# Patient Record
Sex: Female | Born: 1980 | Race: Black or African American | Hispanic: No | Marital: Single | Smoking: Never smoker
Health system: Southern US, Community
[De-identification: ages and names within clinical notes are randomized; demographics above are authoritative.]

## PROBLEM LIST (undated history)

## (undated) ENCOUNTER — Emergency Department (HOSPITAL_COMMUNITY): Admission: EM | Payer: BC Managed Care – PPO | Source: Home / Self Care

## (undated) DIAGNOSIS — Z789 Other specified health status: Secondary | ICD-10-CM

## (undated) HISTORY — DX: Other specified health status: Z78.9

## (undated) HISTORY — PX: BUNIONECTOMY: SHX129

---
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
## 2000-06-07 ENCOUNTER — Inpatient Hospital Stay (HOSPITAL_COMMUNITY): Admission: AD | Admit: 2000-06-07 | Discharge: 2000-06-07 | Payer: Self-pay | Admitting: Obstetrics & Gynecology

## 2000-07-06 ENCOUNTER — Emergency Department (HOSPITAL_COMMUNITY): Admission: EM | Admit: 2000-07-06 | Discharge: 2000-07-06 | Payer: Self-pay | Admitting: Emergency Medicine

## 2001-06-07 ENCOUNTER — Inpatient Hospital Stay (HOSPITAL_COMMUNITY): Admission: AD | Admit: 2001-06-07 | Discharge: 2001-06-07 | Payer: Self-pay | Admitting: *Deleted

## 2001-06-14 ENCOUNTER — Emergency Department (HOSPITAL_COMMUNITY): Admission: EM | Admit: 2001-06-14 | Discharge: 2001-06-14 | Payer: Self-pay | Admitting: Emergency Medicine

## 2003-10-19 ENCOUNTER — Inpatient Hospital Stay (HOSPITAL_COMMUNITY): Admission: AD | Admit: 2003-10-19 | Discharge: 2003-10-19 | Payer: Self-pay | Admitting: *Deleted

## 2003-10-22 ENCOUNTER — Encounter: Admission: RE | Admit: 2003-10-22 | Discharge: 2003-10-22 | Payer: Self-pay | Admitting: *Deleted

## 2010-01-26 ENCOUNTER — Inpatient Hospital Stay (HOSPITAL_COMMUNITY)
Admission: AD | Admit: 2010-01-26 | Discharge: 2010-01-26 | Payer: Self-pay | Source: Home / Self Care | Admitting: Obstetrics & Gynecology

## 2010-03-22 ENCOUNTER — Encounter: Payer: Self-pay | Admitting: Obstetrics and Gynecology

## 2010-05-13 LAB — URINALYSIS, ROUTINE W REFLEX MICROSCOPIC
Bilirubin Urine: NEGATIVE
Nitrite: NEGATIVE
Specific Gravity, Urine: 1.02 (ref 1.005–1.030)
Urobilinogen, UA: 0.2 mg/dL (ref 0.0–1.0)
pH: 5.5 (ref 5.0–8.0)

## 2010-07-08 ENCOUNTER — Inpatient Hospital Stay (HOSPITAL_COMMUNITY)
Admission: AD | Admit: 2010-07-08 | Payer: Medicaid Other | Source: Ambulatory Visit | Admitting: Obstetrics and Gynecology

## 2010-07-10 ENCOUNTER — Inpatient Hospital Stay (HOSPITAL_COMMUNITY)
Admission: AD | Admit: 2010-07-10 | Discharge: 2010-07-14 | DRG: 766 | Disposition: A | Discharge status: Home / Self Care | Payer: 59 | Source: Ambulatory Visit | Attending: Obstetrics and Gynecology | Admitting: Obstetrics and Gynecology

## 2010-07-10 ENCOUNTER — Inpatient Hospital Stay (HOSPITAL_COMMUNITY): Payer: 59

## 2010-07-10 ENCOUNTER — Encounter (HOSPITAL_COMMUNITY): Payer: Self-pay

## 2010-07-10 LAB — CBC
Hemoglobin: 12.4 g/dL (ref 12.0–15.0)
MCH: 27.8 pg (ref 26.0–34.0)
Platelets: 194 10*3/uL (ref 150–400)
RBC: 4.46 MIL/uL (ref 3.87–5.11)

## 2010-07-11 ENCOUNTER — Other Ambulatory Visit: Payer: Self-pay | Admitting: Obstetrics and Gynecology

## 2010-07-12 LAB — CBC
MCH: 27.2 pg (ref 26.0–34.0)
MCHC: 33.2 g/dL (ref 30.0–36.0)
MCV: 81.8 fL (ref 78.0–100.0)
Platelets: 187 10*3/uL (ref 150–400)
RBC: 3.35 MIL/uL — ABNORMAL LOW (ref 3.87–5.11)

## 2010-07-18 ENCOUNTER — Encounter (HOSPITAL_COMMUNITY): Payer: Self-pay | Admitting: Radiology

## 2010-07-18 ENCOUNTER — Inpatient Hospital Stay (HOSPITAL_COMMUNITY): Payer: 59

## 2010-07-18 ENCOUNTER — Inpatient Hospital Stay (HOSPITAL_COMMUNITY)
Admission: AD | Admit: 2010-07-18 | Discharge: 2010-07-21 | DRG: 776 | Disposition: A | Discharge status: Home / Self Care | Payer: 59 | Source: Ambulatory Visit | Attending: Obstetrics and Gynecology | Admitting: Obstetrics and Gynecology

## 2010-07-18 DIAGNOSIS — J811 Chronic pulmonary edema: Secondary | ICD-10-CM | POA: Diagnosis present

## 2010-07-18 DIAGNOSIS — D649 Anemia, unspecified: Secondary | ICD-10-CM | POA: Diagnosis present

## 2010-07-18 DIAGNOSIS — J90 Pleural effusion, not elsewhere classified: Secondary | ICD-10-CM | POA: Diagnosis present

## 2010-07-18 DIAGNOSIS — O99893 Other specified diseases and conditions complicating puerperium: Principal | ICD-10-CM | POA: Diagnosis present

## 2010-07-18 DIAGNOSIS — O9081 Anemia of the puerperium: Secondary | ICD-10-CM | POA: Diagnosis present

## 2010-07-18 LAB — URINALYSIS, ROUTINE W REFLEX MICROSCOPIC
Glucose, UA: NEGATIVE mg/dL
Specific Gravity, Urine: 1.015 (ref 1.005–1.030)
pH: 8.5 — ABNORMAL HIGH (ref 5.0–8.0)

## 2010-07-18 LAB — CBC
MCH: 26.5 pg (ref 26.0–34.0)
MCV: 80.1 fL (ref 78.0–100.0)
Platelets: 307 10*3/uL (ref 150–400)
RDW: 15.7 % — ABNORMAL HIGH (ref 11.5–15.5)
WBC: 10.2 10*3/uL (ref 4.0–10.5)

## 2010-07-18 LAB — DIFFERENTIAL
Basophils Relative: 0 % (ref 0–1)
Eosinophils Absolute: 0.1 10*3/uL (ref 0.0–0.7)
Eosinophils Relative: 1 % (ref 0–5)
Lymphs Abs: 1.7 10*3/uL (ref 0.7–4.0)
Monocytes Relative: 7 % (ref 3–12)

## 2010-07-18 LAB — URINE MICROSCOPIC-ADD ON

## 2010-07-18 LAB — COMPREHENSIVE METABOLIC PANEL
Albumin: 1.8 g/dL — ABNORMAL LOW (ref 3.5–5.2)
BUN: 6 mg/dL (ref 6–23)
Creatinine, Ser: 0.58 mg/dL (ref 0.4–1.2)
GFR calc Af Amer: >60 mL/min (ref >60)
Total Protein: 5.6 g/dL — ABNORMAL LOW (ref 6.0–8.3)

## 2010-07-18 LAB — D-DIMER, QUANTITATIVE: D-Dimer, Quant: 12.41 ug/mL-FEU — ABNORMAL HIGH (ref 0.00–0.48)

## 2010-07-19 ENCOUNTER — Inpatient Hospital Stay (HOSPITAL_COMMUNITY): Payer: 59

## 2010-07-19 LAB — PRO B NATRIURETIC PEPTIDE
Pro B Natriuretic peptide (BNP): 892.3 pg/mL — ABNORMAL HIGH (ref 0–125)
Pro B Natriuretic peptide (BNP): 638.1 pg/mL — ABNORMAL HIGH (ref 0–125)

## 2010-07-19 LAB — CBC
Hemoglobin: 8.4 g/dL — ABNORMAL LOW (ref 12.0–15.0)
MCH: 26.8 pg (ref 26.0–34.0)
MCHC: 33.3 g/dL (ref 30.0–36.0)

## 2010-07-19 LAB — BASIC METABOLIC PANEL
CO2: 25 mEq/L (ref 19–32)
Calcium: 8.7 mg/dL (ref 8.4–10.5)
Creatinine, Ser: 0.54 mg/dL (ref 0.4–1.2)
GFR calc Af Amer: >60 mL/min (ref >60)

## 2010-07-19 MED ORDER — IOHEXOL 300 MG/ML  SOLN
100.0000 mL | Freq: Once | INTRAMUSCULAR | Status: AC | PRN
  Administered 2010-07-19: 100 mL via INTRAVENOUS

## 2010-07-20 LAB — BASIC METABOLIC PANEL
BUN: 8 mg/dL (ref 6–23)
Calcium: 8.8 mg/dL (ref 8.4–10.5)
Creatinine, Ser: 0.63 mg/dL (ref 0.4–1.2)
GFR calc non Af Amer: >60 mL/min (ref >60)
Potassium: 3.6 mEq/L (ref 3.5–5.1)

## 2010-07-20 LAB — CBC
MCH: 26.8 pg (ref 26.0–34.0)
MCV: 80.4 fL (ref 78.0–100.0)
Platelets: 355 10*3/uL (ref 150–400)
RDW: 15.8 % — ABNORMAL HIGH (ref 11.5–15.5)
WBC: 11.5 10*3/uL — ABNORMAL HIGH (ref 4.0–10.5)

## 2010-07-20 LAB — PRO B NATRIURETIC PEPTIDE: Pro B Natriuretic peptide (BNP): 167.2 pg/mL — ABNORMAL HIGH (ref 0–125)

## 2010-07-21 ENCOUNTER — Inpatient Hospital Stay (HOSPITAL_COMMUNITY): Payer: 59

## 2010-07-21 LAB — BASIC METABOLIC PANEL
Calcium: 8.8 mg/dL (ref 8.4–10.5)
GFR calc Af Amer: >60 mL/min (ref >60)
GFR calc non Af Amer: >60 mL/min (ref >60)
Sodium: 139 mEq/L (ref 135–145)

## 2010-07-21 LAB — CBC
MCHC: 33.2 g/dL (ref 30.0–36.0)
Platelets: 394 10*3/uL (ref 150–400)
RDW: 16.1 % — ABNORMAL HIGH (ref 11.5–15.5)

## 2010-07-21 NOTE — H&P (Signed)
[name], [name]             ACCOUNT NO.:  [id number]  MEDICAL RECORD NO.:  [id number]         PATIENT TYPE:  WINP  LOCATION:  315                           FACILITY:  [facility]  PHYSICIAN:  Pieter Partridge, MD   DATE OF BIRTH:  Jan 13, 1981  DATE OF ADMISSION: DATE OF DISCHARGE:                             HISTORY & PHYSICAL   ADMISSION DIAGNOSIS:  Status post cesarean section 1 week ago with pulmonary edema.  ANTICIPATED PROCEDURES:  IV Lasix and a cardiac workup.  HISTORY OF PRESENT ILLNESS:  Abigail Stuart is a 30 year old gravida 2, para 0-0-1-0 who delivered a viable female infant via cesarean section on Jul 11, 2010.  Indication for the cesarean section was failure to progress, light meconium, estimated blood loss at that time was 1000 mL.  Her postoperative course was uncomplicated.  She did have hematocrit of 27.4 at the time of discharge but lungs were clear.  Abigail Stuart presents today with a 2-day history of chest pain and shortness of breath, shortness of breath is worse when she lies flat, and she feels like her "chest is going to cave in."  In the past 2 days, she has had chills but no fever.  She has had a headache for the past 2 days but denies any visual changes.  It appears that this was a gradual onset and has progressively worsened.  She denies any sick contacts. She has had a minimal cough.  She is exclusively breast feeding which has been uncomplicated to date.  REVIEW OF SYSTEMS:  She denies any nausea, vomiting again she has night sweats and headache.  Lower extremities have been swollen and appear approximately 3+ pitting edema, but the patient says that this has improved from previous.  GENITOURINARY:  She does complain of burning after she finishes urinating and suspects she has urinary tract infection.  Her lochia is minimal and no issues.  She has no breast tenderness.  No breast engorgement.  The patient denies any leg pain or discoloration of her lower  extremities.  ALLERGIES:  No known drug allergies.  PAST MEDICAL HISTORY:  Remarkable for history of UTI, sickle cell trait, genital herpes, and depression.  PAST SURGICAL HISTORY:  She had a bunionectomy in May 2011.  PAST SURGICAL HISTORY:  Bunionectomy in May 2011.  OB HISTORY:  In 2003, she had an elective abortion at 10 weeks and she had this pregnancy, delivering and May of this year 2012.  Pregnancy was complicated by unwanted pregnancy, potential adoption, and the patient opted to keep the baby.  She also has possibly Bartholin gland cyst, history of depression, and anemia during the pregnancy.  FAMILY HISTORY:  Her brother has asthma.  Her maternal grandmother has congestive heart failure, breast cancer, and diabetes.  The patient has sickle cell.  SOCIAL HISTORY:  She is single, unemployed.  Lives alone, has support from family.  No tobacco, alcohol, or drug use reported.  PHYSICAL EXAMINATION:  VITAL SIGNS:  Temperature is 97.7 with 98.9 upon arrival, blood pressure is 123/77, heart rate is 52, and respirations 20.  O2 sats of 97% on room air. GENERAL:  The  patient is in no acute distress, however, when speaking for period of time she seems to take needed breath, reports some discomfort lying in the supine position at 45 degrees angle. HEENT:  No neck masses. CARDIOVASCULAR:  Regular rate and rhythm. BREASTS:  Breast are soft, not engorged, no erythema.  Nipples appear normal and without cracks. LUNGS:  Clear to auscultation.  Decreased breath sounds at the right base.  No crackles appreciated. ABDOMEN:  Soft, nontender, and nondistended.  Uterine fundus is not palpable.  Her incision still has Steri-Strips and appears clean and nontender.  No foul older noted. PELVIC:  Deferred. EXTREMITIES:  She has 3+ pitting edema up to the knee.  Ankles are impressive.  Again the patient says this is an improvement from previous, she has a negative Homans sign.  She does not  have any calf tenderness. NEURO:  2 to 3+ deep tendon reflexes.  LABORATORY DATA:  Her hemoglobin is 8.1.  White count is 10.2, platelets are 307.  Her differential is she does not have a left shift.  CMP is within normal limits.  Her creatinine is 0.58, glucose is 86.  LFTs are 17 at 19.  Urine she has large blood which is likely due to lochia, no ketones, small leukocyte esterase, and no nitrite.  She has minimal white blood cells on her urine and few epithelial.  Two-view chest x-ray showed mild interstitial prominence is felt to relate to relative fluid overload from recent pregnancy.  There is also a tiny bilateral pleural effusion effusions.  Heart is mildly enlarged.  There are minimal interstitial prominences in lungs also maybe a component of bronchitis or early pneumonia.  ASSESSMENT:  This is a 30 year old gravida 2, para 0-0-1-0 status post primary cesarean section 1 week with acute shortness of breath and chest pain and chest x-ray reveals small amount of bilateral pleural effusions indicative of pulmonary edema.  Heart is slightly enlarged.  The patient does report some of the findings on x-ray are suspicious for pneumonia. However, the white count is negative but the patient does report intermittent fevers and chills.  The patient does have some findings of pneumonia on the x-ray with normal white count, but she does report some fevers and chills. Concerned about the bilateral pleural effusions and orthopnea.  The patient will be treated for pulmonary edema, etiology of pulmonary edema needs to be investigated.  I do not think this is severe preeclampsia. Her blood pressure is normal, platelets are normal, and liver enzymes are also normal.  Pulmonary edema could be due to potentially some difficulty with cardiac in nature.  I will have an Internal Medicine consult to rule out postpartum cardiomyopathy, I suspect that we potentially may do a 2-D echo and if warranted.   EKG has also been ordered but has not then but has not been done.  Also for DVT with pulmonary embolus was suspected due to shortness of breath.  Her extremities are significantly swollen; and by the patient's history, it is an improvement.  There is no clinical evidence of a deep DVT.  If a spiral CT need to be ordered that is not by [place], if it is warranted, the hospitalist can order spiral CT.  The patient has complaints of burning when she urinates or pain at the end of her of urination, however, her urinalysis appears normal and a good clean catch, so I will withhold any treatment at this time.  I have reviewed all of the information with the  patient and discussed with her that she will be observed overnight and a hospitalist will be consulted.  She will get Lasix either 20 or 40 mg IV for some of the fluid and then we will potentially do a cardiac echo if necessary.     Pieter Partridge, MD     EBV/MEDQ  D:  07/18/2010  T:  07/19/2010  Job:  [id number]  Electronically Signed by Geryl Rankins MD on 07/21/2010 05:08:34 PM

## 2010-08-25 NOTE — Op Note (Signed)
  [name], Abigail Stuart             ACCOUNT NO.:  [id number]  MEDICAL RECORD NO.:  [id number]          PATIENT TYPE:  LOCATION:                                 FACILITY:  PHYSICIAN:  Arlyce Harman, MD          DATE OF BIRTH:  DATE OF PROCEDURE:  07/11/2010 DATE OF DISCHARGE:                              OPERATIVE REPORT   PROCEDURE:  Primary low-transverse cervical cesarean section.  PREOPERATIVE DIAGNOSES:  Elective sterilization, failed Essure, and menorrhagia.  POSTOPERATIVE DIAGNOSES:  Elective sterilization, failed Essure, and menorrhagia.  SURGEON:  Arlyce Harman, MD  No assistant.  The incision was made into the skin in a low-transverse cervical fashion and extended through the abdominal layers.  The uterus delivered.  The uterus was entered in a low-transverse cervical fashion and the infant was delivered.  The fluid was minimally meconium.  The head was delivered.  It was in occiput posterior position and no other mouth was suctioned out.  The infant was handed off to the nurse.  Uterus was sponged out and then closed with #1 Vicryl in a running locked fashion. The second layer being that of a modified Lambert stitch.  After hemostasis was obtained, the abdomen was closed.  The abdomen was irrigated and suctioned out and then it was examined and found to be normal without excess bleeding.  The peritoneum was closed.  The fascia was closed with 0-Vicryl in a running locked fashion.  The subcu was closed with 2-0 plain in a running fashion and the skin was approximated with Mellody Dance needle with 4-0 Vicryl suture and this was closed in a subcuticular fashion.  At the end of the procedure, sponge and instrument counts were correct.     Arlyce Harman, MD     EG/MEDQ  D:  07/11/2010  T:  07/12/2010  Job:  [id number]  Electronically Signed by Arlyce Harman MD on 08/25/2010 10:09:52 AM

## 2010-08-25 NOTE — Discharge Summary (Signed)
  Abigail Stuart, Abigail Stuart             ACCOUNT NO.:  [id number]  MEDICAL RECORD NO.:  [id number]           PATIENT TYPE:  I  LOCATION:  9146                          [facility]ILITY:  [facility]  PHYSICIAN:  Arlyce Harman, MD     DATE OF BIRTH:  [birth year]  DATE OF ADMISSION:  07/11/2010 DATE OF DISCHARGE:  07/14/2010                              DISCHARGE SUMMARY   The patient was admitted on Jul 11, 2010, and was in labor.  Labor progressed slowly and failed to progress for over 3-4 hours in spite of adequate labor and Pitocin augmentation; therefore, decision was made for  cesarean section.    The patient underwent primary low-transverse cervical cesarean section  and her postoperative course was uncomplicated.  She was then discharged  on Jul 14, 2010, in excellent condition and will follow up in my office  in 2 weeks for an incision check.  DISCHARGE MEDICATIONS: 1. Percocet 5 mg, she has 30 tablets, she is to take 1-2 every 4-6     hours as needed for pain. 2. Prenatal vitamins daily. 3. Iron tablets daily. 4. Stool Softeners as needed daily.  LABORATORY DATA:  Hemoglobin was 9.1 and hematocrit 27.4 on Jul 12, 2010.  Condition:  excellent  Instructions:  Call office for temperature of 101 or greater, heaving bleeding or severe pain. No sex for 6 weeks.Regualr diet.    Arlyce Harman, MD     EG/MEDQ  D:  07/14/2010  T:  07/14/2010  Job:  [id number]  Electronically Signed by Arlyce Harman MD on 08/25/2010 10:09:49 AM

## 2010-09-11 ENCOUNTER — Other Ambulatory Visit: Payer: Self-pay | Admitting: Internal Medicine

## 2010-09-11 ENCOUNTER — Ambulatory Visit
Admission: RE | Admit: 2010-09-11 | Discharge: 2010-09-11 | Disposition: A | Discharge status: Home / Self Care | Payer: 59 | Source: Ambulatory Visit | Attending: Internal Medicine | Admitting: Internal Medicine

## 2010-09-11 DIAGNOSIS — R200 Anesthesia of skin: Secondary | ICD-10-CM

## 2013-01-07 ENCOUNTER — Emergency Department (HOSPITAL_BASED_OUTPATIENT_CLINIC_OR_DEPARTMENT_OTHER)
Admission: EM | Admit: 2013-01-07 | Discharge: 2013-01-08 | Disposition: A | Discharge status: Home / Self Care | Payer: Medicaid Other | Attending: Emergency Medicine | Admitting: Emergency Medicine

## 2013-01-07 ENCOUNTER — Encounter (HOSPITAL_BASED_OUTPATIENT_CLINIC_OR_DEPARTMENT_OTHER): Payer: Self-pay | Admitting: Emergency Medicine

## 2013-01-07 DIAGNOSIS — G43809 Other migraine, not intractable, without status migrainosus: Secondary | ICD-10-CM | POA: Insufficient documentation

## 2013-01-07 DIAGNOSIS — G43109 Migraine with aura, not intractable, without status migrainosus: Secondary | ICD-10-CM

## 2013-01-07 MED ORDER — SODIUM CHLORIDE 0.9 % IV SOLN
1000.0000 mL | Freq: Once | INTRAVENOUS | Status: AC
  Administered 2013-01-08: 1000 mL via INTRAVENOUS

## 2013-01-07 MED ORDER — METOCLOPRAMIDE HCL 5 MG/ML IJ SOLN
10.0000 mg | Freq: Once | INTRAMUSCULAR | Status: AC
  Administered 2013-01-08: 10 mg via INTRAVENOUS
  Filled 2013-01-07: qty 2

## 2013-01-07 MED ORDER — SODIUM CHLORIDE 0.9 % IV SOLN
1000.0000 mL | INTRAVENOUS | Status: DC
  Administered 2013-01-08: 1000 mL via INTRAVENOUS

## 2013-01-07 MED ORDER — DIPHENHYDRAMINE HCL 50 MG/ML IJ SOLN
25.0000 mg | Freq: Once | INTRAMUSCULAR | Status: AC
  Administered 2013-01-08: 25 mg via INTRAVENOUS
  Filled 2013-01-07: qty 1

## 2013-01-07 NOTE — ED Notes (Signed)
C/o problems with right peripheral vision yesterday-states field of vision was decreased.  Now noting the same to her left eye today.  C/o soreness in her eye muscles.  Denies similar problem before.  History of scratched cornea.  Has not been wearing her contacts.  States her vision is currently blurry in her right eye.

## 2013-01-07 NOTE — ED Provider Notes (Signed)
CSN: [id number]     Arrival date & time 01/07/13  2128 History  This chart was scribed for Dione Booze, MD by Dorothey Baseman, ED Scribe. This patient was seen in room MH07/MH07 and the patient's care was started at 11:37 PM.    Chief Complaint  Patient presents with  . Eye Problem   The history is provided by the patient. No language interpreter was used.   HPI Comments: Abigail Stuart is a 32 y.o. female who presents to the Emergency Department complaining of  blurred vision that she states "feels like a film over my eye that won't go away," especially peripherally, onset yesterday that began in the left eye and she states that similar problems in the right eye presented today, right worse than left currently. She denies headache, but reports an associated pressure-like sensation to the right temple. She denies any potential injury or trauma to the area. Patient reports a history of corneal abrasion several months ago that was treated with eye drops without complication. Patient reports that her current symptoms do not feel similar to her prior corneal abrasion. She denies photophobia, nausea, emesis, dizziness, fever,or chills. She denies any other pertinent medical history. Patient is a non-smoker and an occasional, social drinker.   History reviewed. No pertinent past medical history. Past Surgical History  Procedure Laterality Date  . Bunionectomy    . Cesarean section     History reviewed. No pertinent family history. History  Substance Use Topics  . Smoking status: Never Smoker   . Smokeless tobacco: Not on file  . Alcohol Use: Yes     Comment: socially   OB History   Grav Para Term Preterm Abortions TAB SAB Ect Mult Living   1              Review of Systems  A complete 10 system review of systems was obtained and all systems are negative except as noted in the HPI and PMH.   Allergies  Review of patient's allergies indicates no known allergies.  Home Medications  No  current outpatient prescriptions on file.  Triage Vitals: BP 117/81  Temp(Src) 99 F (37.2 C) (Oral)  Resp 20  Ht 5\' 4"  (1.626 m)  Wt 193 lb (87.544 kg)  BMI 33.11 kg/m2  SpO2 100%  LMP 12/05/2012  Physical Exam  Nursing note and vitals reviewed. Constitutional: She is oriented to person, place, and time. She appears well-developed and well-nourished. No distress.  HENT:  Head: Normocephalic and atraumatic.  Mild photophobia present. Mild tenderness to palpation to the right temporalis muscle. Fundi poorly visualized. Anterior chambers clear.  Eyes: Conjunctivae and EOM are normal. Pupils are equal, round, and reactive to light.  Neck: Normal range of motion. Neck supple.  Cardiovascular: Normal rate, regular rhythm and normal heart sounds.  Exam reveals no gallop.   No murmur heard. Pulmonary/Chest: Effort normal and breath sounds normal. No respiratory distress. She has no wheezes. She has no rales.  Abdominal: She exhibits no distension.  Musculoskeletal: Normal range of motion.  Neurological: She is alert and oriented to person, place, and time.  Skin: Skin is warm and dry.  Psychiatric: She has a normal mood and affect. Her behavior is normal.    ED Course  Procedures (including critical care time)  DIAGNOSTIC STUDIES: Oxygen Saturation is 100% on room air, normal by my interpretation.    COORDINATION OF CARE: 11:41 PM- Ordered IV fluids, Reglan, and Benadryl to manage symptoms. Advised patient to follow  up with the referred ophthalmologist, especially if there are any new or worsening symptoms or if symptoms do not improve. Discussed treatment plan with patient at bedside and patient verbalized agreement.    MDM   1. Ocular migraine    Headache and blurred vision no with essentially normal eye exam. I suspect that she actually has a migraine headache with vision changes secondary to that. She will be given IV fluids, IV metoclopramide, IV diphenhydramine and  reassessed.  Headache has resolved with above noted treatment and vision has improved substantially. She will be referred to on-call of the myalgias to make sure there is no underlying eye problem, and will also be given a prescription for metoclopramide.  I personally performed the services described in this documentation, which was scribed in my presence. The recorded information has been reviewed and is accurate.      Dione Booze, MD 01/08/13 0120

## 2013-01-08 MED ORDER — METOCLOPRAMIDE HCL 10 MG PO TABS: 10.0000 mg | ORAL_TABLET | Freq: Four times a day (QID) | ORAL | Status: DC | PRN

## 2013-01-08 MED ORDER — METOCLOPRAMIDE HCL 5 MG/ML IJ SOLN
INTRAMUSCULAR | Status: AC
  Filled 2013-01-08: qty 2

## 2013-01-08 NOTE — Discharge Instructions (Signed)
Migraine Headache A migraine headache is an intense, throbbing pain on one or both sides of your head. A migraine can last for 30 minutes to several hours. CAUSES  The exact cause of a migraine headache is not always known. However, a migraine may be caused when nerves in the brain become irritated and release chemicals that cause inflammation. This causes pain. SYMPTOMS  Pain on one or both sides of your head.  Pulsating or throbbing pain.  Severe pain that prevents daily activities.  Pain that is aggravated by any physical activity.  Nausea, vomiting, or both.  Dizziness.  Pain with exposure to bright lights, loud noises, or activity.  General sensitivity to bright lights, loud noises, or smells. Before you get a migraine, you may get warning signs that a migraine is coming (aura). An aura may include:  Seeing flashing lights.  Seeing bright spots, halos, or zig-zag lines.  Having tunnel vision or blurred vision.  Having feelings of numbness or tingling.  Having trouble talking.  Having muscle weakness. MIGRAINE TRIGGERS  Alcohol.  Smoking.  Stress.  Menstruation.  Aged cheeses.  Foods or drinks that contain nitrates, glutamate, aspartame, or tyramine.  Lack of sleep.  Chocolate.  Caffeine.  Hunger.  Physical exertion.  Fatigue.  Medicines used to treat chest pain (nitroglycerine), birth control pills, estrogen, and some blood pressure medicines. DIAGNOSIS  A migraine headache is often diagnosed based on:  Symptoms.  Physical examination.  A CT scan or MRI of your head. TREATMENT Medicines may be given for pain and nausea. Medicines can also be given to help prevent recurrent migraines.  HOME CARE INSTRUCTIONS  Only take over-the-counter or prescription medicines for pain or discomfort as directed by your caregiver. The use of long-term narcotics is not recommended.  Lie down in a dark, quiet room when you have a migraine.  Keep a journal  to find out what may trigger your migraine headaches. For example, write down:  What you eat and drink.  How much sleep you get.  Any change to your diet or medicines.  Limit alcohol consumption.  Quit smoking if you smoke.  Get 7 to 9 hours of sleep, or as recommended by your caregiver.  Limit stress.  Keep lights dim if bright lights bother you and make your migraines worse. SEEK IMMEDIATE MEDICAL CARE IF:   Your migraine becomes severe.  You have a fever.  You have a stiff neck.  You have vision loss.  You have muscular weakness or loss of muscle control.  You start losing your balance or have trouble walking.  You feel faint or pass out.  You have severe symptoms that are different from your first symptoms. MAKE SURE YOU:   Understand these instructions.  Will watch your condition.  Will get help right away if you are not doing well or get worse. Document Released: 02/16/2005 Document Revised: 05/11/2011 Document Reviewed: 02/06/2011 [facility] Patient Information 2014 [place], [place].  Metoclopramide tablets What is this medicine? METOCLOPRAMIDE (met oh kloe PRA mide) is used to treat the symptoms of gastroesophageal reflux disease (GERD) like heartburn. It is also used to treat people with slow emptying of the stomach and intestinal tract. This medicine may be used for other purposes; ask your health care provider or pharmacist if you have questions. COMMON BRAND NAME(S): Reglan What should I tell my health care provider before I take this medicine? They need to know if you have any of these conditions: -breast cancer -depression -diabetes -heart failure -  high blood pressure -kidney disease -liver disease -Parkinson's disease or a movement disorder -pheochromocytoma -seizures -stomach obstruction, bleeding, or perforation -an unusual or allergic reaction to metoclopramide, procainamide, sulfites, other medicines, foods, dyes, or  preservatives -pregnant or trying to get pregnant -breast-feeding How should I use this medicine? Take this medicine by mouth with a glass of water. Follow the directions on the prescription label. Take this medicine on an empty stomach, about 30 minutes before eating. Take your doses at regular intervals. Do not take your medicine more often than directed. Do not stop taking except on the advice of your doctor or health care professional. A special MedGuide will be given to you by the pharmacist with each prescription and refill. Be sure to read this information carefully each time. Talk to your pediatrician regarding the use of this medicine in children. Special care may be needed. Overdosage: If you think you have taken too much of this medicine contact a poison control center or emergency room at once. NOTE: This medicine is only for you. Do not share this medicine with others. What if I miss a dose? If you miss a dose, take it as soon as you can. If it is almost time for your next dose, take only that dose. Do not take double or extra doses. What may interact with this medicine? -acetaminophen -cyclosporine -digoxin -medicines for blood pressure -medicines for diabetes, including insulin -medicines for hay fever and other allergies -medicines for depression, especially an Monoamine Oxidase Inhibitor (MAOI) -medicines for Parkinson's disease, like levodopa -medicines for sleep or for pain -tetracycline This list may not describe all possible interactions. Give your health care provider a list of all the medicines, herbs, non-prescription drugs, or dietary supplements you use. Also tell them if you smoke, drink alcohol, or use illegal drugs. Some items may interact with your medicine. What should I watch for while using this medicine? It may take a few weeks for your stomach condition to start to get better. However, do not take this medicine for longer than 12 weeks. The longer you take  this medicine, and the more you take it, the greater your chances are of developing serious side effects. If you are an elderly patient, a female patient, or you have diabetes, you may be at an increased risk for side effects from this medicine. Contact your doctor immediately if you start having movements you cannot control such as lip smacking, rapid movements of the tongue, involuntary or uncontrollable movements of the eyes, head, arms and legs, or muscle twitches and spasms. Patients and their families should watch out for worsening depression or thoughts of suicide. Also watch out for any sudden or severe changes in feelings such as feeling anxious, agitated, panicky, irritable, hostile, aggressive, impulsive, severely restless, overly excited and hyperactive, or not being able to sleep. If this happens, especially at the beginning of treatment or after a change in dose, call your doctor. Do not treat yourself for high fever. Ask your doctor or health care professional for advice. You may get drowsy or dizzy. Do not drive, use machinery, or do anything that needs mental alertness until you know how this drug affects you. Do not stand or sit up quickly, especially if you are an older patient. This reduces the risk of dizzy or fainting spells. Alcohol can make you more drowsy and dizzy. Avoid alcoholic drinks. What side effects may I notice from receiving this medicine? Side effects that you should report to your doctor or health  care professional as soon as possible: -allergic reactions like skin rash, itching or hives, swelling of the face, lips, or tongue -abnormal production of milk in females -breast enlargement in both males and females -change in the way you walk -difficulty moving, speaking or swallowing -drooling, lip smacking, or rapid movements of the tongue -excessive sweating -fever -involuntary or uncontrollable movements of the eyes, head, arms and legs -irregular heartbeat or  palpitations -muscle twitches and spasms -unusually weak or tired Side effects that usually do not require medical attention (report to your doctor or health care professional if they continue or are bothersome): -change in sex drive or performance -depressed mood -diarrhea -difficulty sleeping -headache -menstrual changes -restless or nervous This list may not describe all possible side effects. Call your doctor for medical advice about side effects. You may report side effects to FDA at 1-800-FDA-1088. Where should I keep my medicine? Keep out of the reach of children. Store at room temperature between 20 and 25 degrees C (68 and 77 degrees F). Protect from light. Keep container tightly closed. Throw away any unused medicine after the expiration date. NOTE: This sheet is a summary. It may not cover all possible information. If you have questions about this medicine, talk to your doctor, pharmacist, or health care provider.  2014, [employer]. (2011-06-16 13:04:38)

## 2014-01-01 ENCOUNTER — Encounter (HOSPITAL_BASED_OUTPATIENT_CLINIC_OR_DEPARTMENT_OTHER): Payer: Self-pay | Admitting: Emergency Medicine

## 2015-08-08 ENCOUNTER — Emergency Department (HOSPITAL_COMMUNITY): Payer: BLUE CROSS/BLUE SHIELD

## 2015-08-08 ENCOUNTER — Emergency Department (HOSPITAL_COMMUNITY)
Admission: EM | Admit: 2015-08-08 | Discharge: 2015-08-08 | Disposition: A | Discharge status: Home / Self Care | Payer: BLUE CROSS/BLUE SHIELD | Attending: Emergency Medicine | Admitting: Emergency Medicine

## 2015-08-08 ENCOUNTER — Encounter (HOSPITAL_COMMUNITY): Payer: Self-pay | Admitting: Emergency Medicine

## 2015-08-08 DIAGNOSIS — S63642A Sprain of metacarpophalangeal joint of left thumb, initial encounter: Secondary | ICD-10-CM | POA: Insufficient documentation

## 2015-08-08 DIAGNOSIS — S5331XA Traumatic rupture of right ulnar collateral ligament, initial encounter: Secondary | ICD-10-CM

## 2015-08-08 DIAGNOSIS — S63641A Sprain of metacarpophalangeal joint of right thumb, initial encounter: Secondary | ICD-10-CM

## 2015-08-08 DIAGNOSIS — Y939 Activity, unspecified: Secondary | ICD-10-CM | POA: Diagnosis not present

## 2015-08-08 DIAGNOSIS — Y92410 Unspecified street and highway as the place of occurrence of the external cause: Secondary | ICD-10-CM | POA: Insufficient documentation

## 2015-08-08 DIAGNOSIS — Z79899 Other long term (current) drug therapy: Secondary | ICD-10-CM | POA: Diagnosis not present

## 2015-08-08 DIAGNOSIS — Y999 Unspecified external cause status: Secondary | ICD-10-CM | POA: Diagnosis not present

## 2015-08-08 DIAGNOSIS — S59912A Unspecified injury of left forearm, initial encounter: Secondary | ICD-10-CM | POA: Diagnosis present

## 2015-08-08 DIAGNOSIS — S51812A Laceration without foreign body of left forearm, initial encounter: Secondary | ICD-10-CM | POA: Diagnosis not present

## 2015-08-08 MED ORDER — DIAZEPAM 2 MG PO TABS: 2.0000 mg | ORAL_TABLET | Freq: Four times a day (QID) | ORAL | Status: DC | PRN

## 2015-08-08 MED ORDER — IBUPROFEN 800 MG PO TABS
800.0000 mg | ORAL_TABLET | Freq: Once | ORAL | Status: AC
  Administered 2015-08-08: 800 mg via ORAL
  Filled 2015-08-08: qty 1

## 2015-08-08 MED ORDER — ACETAMINOPHEN 500 MG PO TABS
1000.0000 mg | ORAL_TABLET | Freq: Once | ORAL | Status: AC
  Administered 2015-08-08: 1000 mg via ORAL
  Filled 2015-08-08: qty 2

## 2015-08-08 NOTE — Discharge Instructions (Signed)
Take 4 over the counter ibuprofen tablets 3 times a day or 2 over-the-counter naproxen tablets twice a day for pain. ° °
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
## 2016-06-01 ENCOUNTER — Other Ambulatory Visit (INDEPENDENT_AMBULATORY_CARE_PROVIDER_SITE_OTHER): Payer: Self-pay | Admitting: Orthopaedic Surgery

## 2016-06-02 NOTE — Telephone Encounter (Signed)
Please advise 

## 2019-11-05 ENCOUNTER — Other Ambulatory Visit: Payer: Self-pay

## 2019-11-05 ENCOUNTER — Observation Stay (HOSPITAL_COMMUNITY)
Admission: EM | Admit: 2019-11-05 | Discharge: 2019-11-06 | Disposition: A | Discharge status: Home / Self Care | Payer: BC Managed Care – PPO | Attending: Surgery | Admitting: Surgery

## 2019-11-05 ENCOUNTER — Emergency Department (HOSPITAL_COMMUNITY): Payer: BC Managed Care – PPO

## 2019-11-05 DIAGNOSIS — Z20822 Contact with and (suspected) exposure to covid-19: Secondary | ICD-10-CM | POA: Diagnosis not present

## 2019-11-05 DIAGNOSIS — R1011 Right upper quadrant pain: Secondary | ICD-10-CM

## 2019-11-05 DIAGNOSIS — K810 Acute cholecystitis: Secondary | ICD-10-CM | POA: Diagnosis not present

## 2019-11-05 DIAGNOSIS — Z79899 Other long term (current) drug therapy: Secondary | ICD-10-CM | POA: Insufficient documentation

## 2019-11-05 DIAGNOSIS — Z419 Encounter for procedure for purposes other than remedying health state, unspecified: Secondary | ICD-10-CM

## 2019-11-05 LAB — COMPREHENSIVE METABOLIC PANEL
ALT: 19 U/L (ref 0–44)
AST: 18 U/L (ref 15–41)
Albumin: 4.2 g/dL (ref 3.5–5.0)
Alkaline Phosphatase: 76 U/L (ref 38–126)
Anion gap: 10 (ref 5–15)
BUN: 9 mg/dL (ref 6–20)
CO2: 25 mmol/L (ref 22–32)
Calcium: 9.2 mg/dL (ref 8.9–10.3)
Chloride: 104 mmol/L (ref 98–111)
Creatinine, Ser: 0.78 mg/dL (ref 0.44–1.00)
GFR calc Af Amer: >60 mL/min (ref >60)
GFR calc non Af Amer: >60 mL/min (ref >60)
Glucose, Bld: 108 mg/dL — ABNORMAL HIGH (ref 70–99)
Potassium: 4 mmol/L (ref 3.5–5.1)
Sodium: 139 mmol/L (ref 135–145)
Total Bilirubin: 0.3 mg/dL (ref 0.3–1.2)
Total Protein: 8.2 g/dL — ABNORMAL HIGH (ref 6.5–8.1)

## 2019-11-05 LAB — CBC
HCT: 37.7 % (ref 36.0–46.0)
Hemoglobin: 12.4 g/dL (ref 12.0–15.0)
MCH: 27.2 pg (ref 26.0–34.0)
MCHC: 32.9 g/dL (ref 30.0–36.0)
MCV: 82.7 fL (ref 80.0–100.0)
Platelets: 257 10*3/uL (ref 150–400)
RBC: 4.56 MIL/uL (ref 3.87–5.11)
RDW: 14.4 % (ref 11.5–15.5)
WBC: 6.3 10*3/uL (ref 4.0–10.5)
nRBC: 0 % (ref 0.0–0.2)

## 2019-11-05 LAB — URINALYSIS, ROUTINE W REFLEX MICROSCOPIC
Bilirubin Urine: NEGATIVE
Glucose, UA: NEGATIVE mg/dL
Ketones, ur: NEGATIVE mg/dL
Nitrite: NEGATIVE
Protein, ur: NEGATIVE mg/dL
Specific Gravity, Urine: 1.012 (ref 1.005–1.030)
pH: 6 (ref 5.0–8.0)

## 2019-11-05 LAB — LIPASE, BLOOD: Lipase: 27 U/L (ref 11–51)

## 2019-11-05 LAB — POC URINE PREG, ED: Preg Test, Ur: NEGATIVE

## 2019-11-05 MED ORDER — MORPHINE SULFATE (PF) 4 MG/ML IV SOLN
8.0000 mg | Freq: Once | INTRAVENOUS | Status: AC
  Administered 2019-11-05: 8 mg via INTRAVENOUS
  Filled 2019-11-05: qty 2

## 2019-11-05 MED ORDER — ONDANSETRON 4 MG PO TBDP: 4.0000 mg | ORAL_TABLET | Freq: Four times a day (QID) | ORAL | Status: DC | PRN

## 2019-11-05 MED ORDER — MORPHINE SULFATE (PF) 4 MG/ML IV SOLN: 4.0000 mg | INTRAVENOUS | Status: DC | PRN

## 2019-11-05 MED ORDER — DOCUSATE SODIUM 100 MG PO CAPS
100.0000 mg | ORAL_CAPSULE | Freq: Two times a day (BID) | ORAL | Status: DC
  Administered 2019-11-06: 100 mg via ORAL
  Filled 2019-11-05: qty 1

## 2019-11-05 MED ORDER — LACTATED RINGERS IV SOLN: INTRAVENOUS | Status: DC

## 2019-11-05 MED ORDER — ONDANSETRON HCL 4 MG/2ML IJ SOLN: 4.0000 mg | Freq: Four times a day (QID) | INTRAMUSCULAR | Status: DC | PRN

## 2019-11-05 MED ORDER — SODIUM CHLORIDE 0.9 % IV SOLN
2.0000 g | Freq: Once | INTRAVENOUS | Status: AC
  Administered 2019-11-06: 2 g via INTRAVENOUS
  Filled 2019-11-05: qty 20

## 2019-11-05 MED ORDER — MORPHINE SULFATE (PF) 2 MG/ML IV SOLN
2.0000 mg | INTRAVENOUS | Status: DC | PRN
  Administered 2019-11-06: 4 mg via INTRAVENOUS
  Administered 2019-11-06: 2 mg via INTRAVENOUS
  Filled 2019-11-05: qty 1
  Filled 2019-11-05: qty 2

## 2019-11-05 MED ORDER — METHOCARBAMOL 1000 MG/10ML IJ SOLN
1000.0000 mg | Freq: Three times a day (TID) | INTRAVENOUS | Status: DC
  Administered 2019-11-06: 1000 mg via INTRAVENOUS
  Filled 2019-11-05 (×2): qty 10
  Filled 2019-11-05: qty 1000
  Filled 2019-11-05: qty 10

## 2019-11-05 MED ORDER — ACETAMINOPHEN 10 MG/ML IV SOLN
1000.0000 mg | Freq: Four times a day (QID) | INTRAVENOUS | Status: DC
  Administered 2019-11-06 (×2): 1000 mg via INTRAVENOUS
  Filled 2019-11-05 (×4): qty 100

## 2019-11-05 MED ORDER — ONDANSETRON HCL 4 MG/2ML IJ SOLN
4.0000 mg | Freq: Once | INTRAMUSCULAR | Status: AC
  Administered 2019-11-05: 4 mg via INTRAVENOUS
  Filled 2019-11-05: qty 2

## 2019-11-05 MED ORDER — LACTATED RINGERS IV SOLN: INTRAVENOUS | Status: AC

## 2019-11-05 MED ORDER — OXYCODONE HCL 5 MG/5ML PO SOLN: 5.0000 mg | ORAL | Status: DC | PRN

## 2019-11-05 MED ORDER — SODIUM CHLORIDE 0.9 % IV SOLN: 2.0000 g | INTRAVENOUS | Status: DC

## 2019-11-05 MED ORDER — ENOXAPARIN SODIUM 40 MG/0.4ML ~~LOC~~ SOLN
40.0000 mg | SUBCUTANEOUS | Status: DC
  Administered 2019-11-06: 40 mg via SUBCUTANEOUS
  Filled 2019-11-05: qty 0.4

## 2019-11-05 MED ORDER — IOHEXOL 300 MG/ML  SOLN
100.0000 mL | Freq: Once | INTRAMUSCULAR | Status: AC | PRN
  Administered 2019-11-05: 100 mL via INTRAVENOUS

## 2019-11-05 MED ORDER — OXYCODONE-ACETAMINOPHEN 5-325 MG PO TABS
1.0000 | ORAL_TABLET | Freq: Once | ORAL | Status: AC
  Administered 2019-11-05: 1 via ORAL
  Filled 2019-11-05: qty 1

## 2019-11-05 MED ORDER — SODIUM CHLORIDE 0.9 % IV BOLUS
1000.0000 mL | Freq: Once | INTRAVENOUS | Status: AC
  Administered 2019-11-05: 1000 mL via INTRAVENOUS

## 2019-11-05 MED ORDER — ONDANSETRON HCL 4 MG/2ML IJ SOLN
4.0000 mg | Freq: Four times a day (QID) | INTRAMUSCULAR | Status: DC | PRN
  Administered 2019-11-06: 4 mg via INTRAVENOUS
  Filled 2019-11-05: qty 2

## 2019-11-05 NOTE — ED Provider Notes (Signed)
Abigail Stuart   CSN: [id number] Arrival date & time: 11/05/19  1053     History Chief Complaint  Patient presents with   Abdominal Pain    Abigail Stuart is a 39 y.o. female.  HPI    Pt comes in with cc of abd pain. Pt has been having RUQ and epigastric abd pain since 5 am. Pain is constant, severe, non radiating and there is associated n/v. No diarrhea. Went to UC and advised to come to the ER.  No past medical history on file.  There are no problems to display for this patient.   Past Surgical History:  Procedure Laterality Date   BUNIONECTOMY     CESAREAN SECTION       OB History    Gravida  1   Para      Term      Preterm      AB      Living        SAB      TAB      Ectopic      Multiple      Live Births              No family history on file.  Social History   Tobacco Use   Smoking status: Never Smoker  Substance Use Topics   Alcohol use: Yes    Comment: socially   Drug use: No    Home Medications Prior to Admission medications   Medication Sig Start Date End Date Taking? Authorizing Provider  albuterol (VENTOLIN HFA) 108 (90 Base) MCG/ACT inhaler Inhale 2 puffs into the lungs every 6 (six) hours as needed for wheezing or shortness of breath.   Yes [provider]  cetirizine (ZYRTEC) 10 MG tablet Take 10 mg by mouth daily as needed for allergies.    Yes [provider]  diphenhydrAMINE (BENADRYL) 25 MG tablet Take 25 mg by mouth at bedtime as needed for allergies.   Yes [provider]  ferrous sulfate 325 (65 FE) MG tablet Take 325 mg by mouth daily with breakfast.   Yes [provider]  gabapentin (NEURONTIN) 300 MG capsule Take 300 mg by mouth at bedtime as needed (nerve pain).   Yes [provider]  ibuprofen (ADVIL) 800 MG tablet Take 800 mg by mouth daily as needed for headache or cramping.   Yes [provider]   montelukast (SINGULAIR) 10 MG tablet Take 10 mg by mouth at bedtime as needed (allergies).  05/29/15  Yes [provider]  Multiple Vitamin (MULTIVITAMIN WITH MINERALS) TABS tablet Take 1-2 tablets by mouth See admin instructions. Take 2 tablets by mouth every morning and 1 tablet at night   Yes [provider]  Multiple Vitamins-Minerals (HAIR/SKIN/NAILS/BIOTIN PO) Take 1-2 tablets by mouth See admin instructions. Take 2 tablets by mouth every morning and 1 tablet at night   Yes [provider]    Allergies    Patient has no known allergies.  Review of Systems   Review of Systems  Constitutional: Positive for activity change.  Gastrointestinal: Positive for abdominal pain and nausea.  Genitourinary: Negative for dysuria and flank pain.  Allergic/Immunologic: Negative for immunocompromised state.  All other systems reviewed and are negative.   Physical Exam Updated Vital Signs BP (!) 115/92 (BP Location: Left Arm)    Pulse 60    Temp 99.1 F (37.3 C) (Oral)    Resp 18  Ht 5\' 3"  (1.6 m)    Wt 111 kg    SpO2 100%    BMI 43.35 kg/m   Physical Exam Vitals and nursing Stuart reviewed.  Constitutional:      Appearance: She is well-developed.  HENT:     Head: Normocephalic and atraumatic.  Cardiovascular:     Rate and Rhythm: Normal rate.  Pulmonary:     Effort: Pulmonary effort is normal.  Abdominal:     General: Bowel sounds are normal.     Tenderness: There is abdominal tenderness in the right upper quadrant and epigastric area. There is no guarding or rebound. Positive signs include Murphy's sign. Negative signs include Rovsing's sign.  Musculoskeletal:     Cervical back: Normal range of motion and neck supple.  Skin:    General: Skin is warm and dry.  Neurological:     Mental Status: She is alert and oriented to person, place, and time.     ED Results / Procedures / Treatments   Labs (all labs ordered are listed, but only abnormal results are  displayed) Labs Reviewed  COMPREHENSIVE METABOLIC PANEL - Abnormal; Notable for the following components:      Result Value   Glucose, Bld 108 (*)    Total Protein 8.2 (*)    All other components within normal limits  URINALYSIS, ROUTINE W REFLEX MICROSCOPIC - Abnormal; Notable for the following components:   Hgb urine dipstick LARGE (*)    Leukocytes,Ua TRACE (*)    Bacteria, UA RARE (*)    All other components within normal limits  LIPASE, BLOOD  CBC  I-STAT BETA HCG BLOOD, ED ([facility], [facility], [facility] ONLY)  POC URINE PREG, ED    EKG None  Radiology No results found.  Procedures Procedures (including critical care time)  Medications Ordered in ED Medications  morphine 4 MG/ML injection 8 mg (has no administration in time range)  sodium chloride 0.9 % bolus 1,000 mL (has no administration in time range)  iohexol (OMNIPAQUE) 300 MG/ML solution 100 mL (has no administration in time range)    ED Course  I have reviewed the triage vital signs and the nursing notes.  Pertinent labs & imaging results that were available during my care of the patient were reviewed by me and considered in my medical decision making (see chart for details).    MDM Rules/Calculators/A&P                          DDx includes: Pancreatitis Hepatobiliary pathology including cholecystitis Gastritis/PUD SBO ACS syndrome Aortic Dissection  Pt comes in with cc of abd pain. CT ordered - there is no available and patient has waited for several hours and doesn't want to return to the ER for repeat US. Wants better pain control. CT ordered.  Final Clinical Impression(s) / ED Diagnoses Final diagnoses:  None    Rx / DC Orders ED Discharge Orders    None       [place], MD 11/05/19 2035

## 2019-11-05 NOTE — ED Notes (Signed)
Patient has a urine culture in the main lab 

## 2019-11-05 NOTE — ED Triage Notes (Signed)
Patient reports to the ER from urgent care. Patient reports she was told she may have a galbladder rupture. Patient's VSS.

## 2019-11-06 ENCOUNTER — Observation Stay (HOSPITAL_COMMUNITY): Payer: BC Managed Care – PPO | Admitting: Certified Registered Nurse Anesthetist

## 2019-11-06 ENCOUNTER — Encounter (HOSPITAL_COMMUNITY): Payer: Self-pay

## 2019-11-06 ENCOUNTER — Encounter (HOSPITAL_COMMUNITY)
Admission: EM | Disposition: A | Discharge status: Home / Self Care | Payer: Self-pay | Source: Home / Self Care | Attending: Emergency Medicine

## 2019-11-06 HISTORY — PX: CHOLECYSTECTOMY: SHX55

## 2019-11-06 LAB — CBC
HCT: 35.9 % — ABNORMAL LOW (ref 36.0–46.0)
Hemoglobin: 12.1 g/dL (ref 12.0–15.0)
MCH: 27.3 pg (ref 26.0–34.0)
MCHC: 33.7 g/dL (ref 30.0–36.0)
MCV: 81 fL (ref 80.0–100.0)
Platelets: 168 10*3/uL (ref 150–400)
RBC: 4.43 MIL/uL (ref 3.87–5.11)
RDW: 14.4 % (ref 11.5–15.5)
WBC: 8.9 10*3/uL (ref 4.0–10.5)
nRBC: 0 % (ref 0.0–0.2)

## 2019-11-06 LAB — BASIC METABOLIC PANEL
Anion gap: 10 (ref 5–15)
BUN: 5 mg/dL — ABNORMAL LOW (ref 6–20)
CO2: 22 mmol/L (ref 22–32)
Calcium: 8.6 mg/dL — ABNORMAL LOW (ref 8.9–10.3)
Chloride: 109 mmol/L (ref 98–111)
Creatinine, Ser: 0.73 mg/dL (ref 0.44–1.00)
GFR calc Af Amer: >60 mL/min (ref >60)
GFR calc non Af Amer: >60 mL/min (ref >60)
Glucose, Bld: 112 mg/dL — ABNORMAL HIGH (ref 70–99)
Potassium: 4.4 mmol/L (ref 3.5–5.1)
Sodium: 141 mmol/L (ref 135–145)

## 2019-11-06 LAB — SURGICAL PCR SCREEN
MRSA, PCR: NEGATIVE
Staphylococcus aureus: NEGATIVE

## 2019-11-06 LAB — SARS CORONAVIRUS 2 BY RT PCR (HOSPITAL ORDER, PERFORMED IN ~~LOC~~ HOSPITAL LAB): SARS Coronavirus 2: NEGATIVE

## 2019-11-06 SURGERY — LAPAROSCOPIC CHOLECYSTECTOMY WITH INTRAOPERATIVE CHOLANGIOGRAM
Anesthesia: General | Site: Abdomen

## 2019-11-06 MED ORDER — PROMETHAZINE HCL 25 MG/ML IJ SOLN
25.0000 mg | Freq: Once | INTRAMUSCULAR | Status: AC
  Administered 2019-11-06: 25 mg via INTRAVENOUS
  Filled 2019-11-06: qty 1

## 2019-11-06 MED ORDER — ROCURONIUM BROMIDE 10 MG/ML (PF) SYRINGE
PREFILLED_SYRINGE | INTRAVENOUS | Status: DC | PRN
  Administered 2019-11-06: 40 mg via INTRAVENOUS
  Administered 2019-11-06: 10 mg via INTRAVENOUS

## 2019-11-06 MED ORDER — DEXAMETHASONE SODIUM PHOSPHATE 10 MG/ML IJ SOLN
INTRAMUSCULAR | Status: DC | PRN
  Administered 2019-11-06: 8 mg via INTRAVENOUS

## 2019-11-06 MED ORDER — PROPOFOL 10 MG/ML IV BOLUS
INTRAVENOUS | Status: DC | PRN
  Administered 2019-11-06: 200 mg via INTRAVENOUS

## 2019-11-06 MED ORDER — PROMETHAZINE HCL 25 MG/ML IJ SOLN: 6.2500 mg | INTRAMUSCULAR | Status: DC | PRN

## 2019-11-06 MED ORDER — KETOROLAC TROMETHAMINE 30 MG/ML IJ SOLN: 30.0000 mg | Freq: Once | INTRAMUSCULAR | Status: DC | PRN

## 2019-11-06 MED ORDER — LACTATED RINGERS IR SOLN
Status: DC | PRN
  Administered 2019-11-06: 1000 mL

## 2019-11-06 MED ORDER — OXYCODONE HCL 5 MG PO TABS: 5.0000 mg | ORAL_TABLET | Freq: Four times a day (QID) | ORAL | 0 refills | Status: AC | PRN

## 2019-11-06 MED ORDER — SUGAMMADEX SODIUM 200 MG/2ML IV SOLN
INTRAVENOUS | Status: DC | PRN
  Administered 2019-11-06: 200 mg via INTRAVENOUS

## 2019-11-06 MED ORDER — FENTANYL CITRATE (PF) 250 MCG/5ML IJ SOLN
INTRAMUSCULAR | Status: AC
  Filled 2019-11-06: qty 5

## 2019-11-06 MED ORDER — LIDOCAINE 2% (20 MG/ML) 5 ML SYRINGE
INTRAMUSCULAR | Status: AC
  Filled 2019-11-06: qty 5

## 2019-11-06 MED ORDER — PROPOFOL 10 MG/ML IV BOLUS
INTRAVENOUS | Status: AC
  Filled 2019-11-06: qty 40

## 2019-11-06 MED ORDER — LIDOCAINE HCL (CARDIAC) PF 100 MG/5ML IV SOSY
PREFILLED_SYRINGE | INTRAVENOUS | Status: DC | PRN
  Administered 2019-11-06: 100 mg via INTRAVENOUS

## 2019-11-06 MED ORDER — OXYCODONE HCL 5 MG PO TABS: 5.0000 mg | ORAL_TABLET | Freq: Four times a day (QID) | ORAL | 0 refills | Status: DC | PRN

## 2019-11-06 MED ORDER — FENTANYL CITRATE (PF) 250 MCG/5ML IJ SOLN
INTRAMUSCULAR | Status: AC
Start: 2019-11-06 — End: ?
  Filled 2019-11-06: qty 5

## 2019-11-06 MED ORDER — HYDROMORPHONE HCL 1 MG/ML IJ SOLN: 0.2500 mg | INTRAMUSCULAR | Status: DC | PRN

## 2019-11-06 MED ORDER — FENTANYL CITRATE (PF) 250 MCG/5ML IJ SOLN
INTRAMUSCULAR | Status: DC | PRN
Start: 2019-11-06 — End: 2019-11-06
  Administered 2019-11-06: 100 ug via INTRAVENOUS
  Administered 2019-11-06: 50 ug via INTRAVENOUS
  Administered 2019-11-06: 150 ug via INTRAVENOUS
  Administered 2019-11-06: 100 ug via INTRAVENOUS

## 2019-11-06 MED ORDER — MIDAZOLAM HCL 2 MG/2ML IJ SOLN
INTRAMUSCULAR | Status: DC | PRN
  Administered 2019-11-06: 2 mg via INTRAVENOUS

## 2019-11-06 MED ORDER — SODIUM CHLORIDE 0.9 % IV SOLN
INTRAVENOUS | Status: AC
  Filled 2019-11-06: qty 20

## 2019-11-06 MED ORDER — SUCCINYLCHOLINE CHLORIDE 200 MG/10ML IV SOSY
PREFILLED_SYRINGE | INTRAVENOUS | Status: DC | PRN
  Administered 2019-11-06: 200 mg via INTRAVENOUS

## 2019-11-06 MED ORDER — BUPIVACAINE-EPINEPHRINE (PF) 0.25% -1:200000 IJ SOLN
INTRAMUSCULAR | Status: AC
  Filled 2019-11-06: qty 30

## 2019-11-06 MED ORDER — ONDANSETRON HCL 4 MG/2ML IJ SOLN
INTRAMUSCULAR | Status: DC | PRN
  Administered 2019-11-06: 4 mg via INTRAVENOUS

## 2019-11-06 MED ORDER — MIDAZOLAM HCL 2 MG/2ML IJ SOLN
INTRAMUSCULAR | Status: AC
  Filled 2019-11-06: qty 2

## 2019-11-06 MED ORDER — MEPERIDINE HCL 50 MG/ML IJ SOLN: 6.2500 mg | INTRAMUSCULAR | Status: DC | PRN

## 2019-11-06 MED ORDER — BUPIVACAINE-EPINEPHRINE (PF) 0.25% -1:200000 IJ SOLN
INTRAMUSCULAR | Status: DC | PRN
  Administered 2019-11-06: 30 mL via PERINEURAL

## 2019-11-06 MED ORDER — ROCURONIUM BROMIDE 10 MG/ML (PF) SYRINGE
PREFILLED_SYRINGE | INTRAVENOUS | Status: AC
  Filled 2019-11-06: qty 10

## 2019-11-06 SURGICAL SUPPLY — 46 items
APPLIER CLIP 5 13 M/L LIGAMAX5 (MISCELLANEOUS) ×2
APPLIER CLIP ROT 10 11.4 M/L (STAPLE) ×2
CHLORAPREP W/TINT 26 (MISCELLANEOUS) ×2 IMPLANT
CLIP APPLIE 5 13 M/L LIGAMAX5 (MISCELLANEOUS) ×1 IMPLANT
CLIP APPLIE ROT 10 11.4 M/L (STAPLE) ×1 IMPLANT
CLIP VESOLOCK MED LG 6/CT (CLIP) IMPLANT
COVER MAYO STAND STRL (DRAPES) IMPLANT
COVER SURGICAL LIGHT HANDLE (MISCELLANEOUS) ×2 IMPLANT
COVER WAND RF STERILE (DRAPES) IMPLANT
DECANTER SPIKE VIAL GLASS SM (MISCELLANEOUS) ×2 IMPLANT
DERMABOND ADVANCED (GAUZE/BANDAGES/DRESSINGS) ×1
DERMABOND ADVANCED .7 DNX12 (GAUZE/BANDAGES/DRESSINGS) ×1 IMPLANT
DRAPE C-ARM 42X120 X-RAY (DRAPES) IMPLANT
ELECT L-HOOK LAP 45CM DISP (ELECTROSURGICAL)
ELECT REM PT RETURN 15FT ADLT (MISCELLANEOUS) ×2 IMPLANT
ELECTRODE L-HOOK LAP 45CM DISP (ELECTROSURGICAL) IMPLANT
GLOVE BIO SURGEON STRL SZ 6 (GLOVE) ×2 IMPLANT
GLOVE INDICATOR 6.5 STRL GRN (GLOVE) ×2 IMPLANT
GOWN STRL REUS W/TWL 2XL LVL3 (GOWN DISPOSABLE) ×2 IMPLANT
GOWN STRL REUS W/TWL XL LVL3 (GOWN DISPOSABLE) ×4 IMPLANT
HEMOSTAT SNOW SURGICEL 2X4 (HEMOSTASIS) IMPLANT
KIT BASIN OR (CUSTOM PROCEDURE TRAY) ×2 IMPLANT
KIT TURNOVER KIT A (KITS) IMPLANT
L-HOOK LAP DISP 36CM (ELECTROSURGICAL)
LHOOK LAP DISP 36CM (ELECTROSURGICAL) IMPLANT
PAD POSITIONING PINK XL (MISCELLANEOUS) IMPLANT
PENCIL SMOKE EVACUATOR (MISCELLANEOUS) IMPLANT
POUCH RETRIEVAL ECOSAC 10 (ENDOMECHANICALS) ×1 IMPLANT
POUCH RETRIEVAL ECOSAC 10MM (ENDOMECHANICALS) ×2
POUCH SPECIMEN RETRIEVAL 10MM (ENDOMECHANICALS) IMPLANT
PROTECTOR NERVE ULNAR (MISCELLANEOUS) IMPLANT
SCISSORS LAP 5X35 DISP (ENDOMECHANICALS) ×2 IMPLANT
SET CHOLANGIOGRAPH MIX (MISCELLANEOUS) IMPLANT
SET IRRIG TUBING LAPAROSCOPIC (IRRIGATION / IRRIGATOR) ×2 IMPLANT
SET TUBE SMOKE EVAC HIGH FLOW (TUBING) IMPLANT
SLEEVE XCEL OPT CAN 5 100 (ENDOMECHANICALS) ×2 IMPLANT
SUT MNCRL AB 4-0 PS2 18 (SUTURE) ×2 IMPLANT
SUT VICRYL 0 UR6 27IN ABS (SUTURE) ×2 IMPLANT
TAPE CLOTH 4X10 WHT NS (GAUZE/BANDAGES/DRESSINGS) IMPLANT
TOWEL OR 17X26 10 PK STRL BLUE (TOWEL DISPOSABLE) ×2 IMPLANT
TOWEL OR NON WOVEN STRL DISP B (DISPOSABLE) ×2 IMPLANT
TRAY LAPAROSCOPIC (CUSTOM PROCEDURE TRAY) ×2 IMPLANT
TROCAR BLADELESS OPT 5 100 (ENDOMECHANICALS) ×2 IMPLANT
TROCAR XCEL BLUNT TIP 100MML (ENDOMECHANICALS) ×4 IMPLANT
TROCAR XCEL NON-BLD 11X100MML (ENDOMECHANICALS) ×2 IMPLANT
TROCAR XCEL NON-BLD 5MMX100MML (ENDOMECHANICALS) IMPLANT

## 2019-11-06 NOTE — Discharge Instructions (Signed)
May shower beginning 11/07/2019. Do not peel off or scrub skin glue. May allow warm soapy water to run over incision, then rinse and pat dry. Do not soak in any water (tubs, hot tubs, pools, lakes, oceans) for one week.   No lifting greater than 5 pounds for six weeks.   Call the office at [phone] for temperature greater than 101.15F, worsening pain, redness or warmth at the incision site.  Please call [phone] to make an appointment for 1 week after surgery for wound check.

## 2019-11-06 NOTE — Anesthesia Procedure Notes (Signed)
Procedure Name: Intubation Date/Time: 11/06/2019 7:38 AM Performed by: Raenette Rover, CRNA Pre-anesthesia Checklist: Patient identified, Emergency Drugs available, Suction available and Patient being monitored Patient Re-evaluated:Patient Re-evaluated prior to induction Oxygen Delivery Method: Circle system utilized Preoxygenation: Pre-oxygenation with 100% oxygen Induction Type: IV induction, Rapid sequence and Cricoid Pressure applied Ventilation: Mask ventilation without difficulty Laryngoscope Size: Mac and 3 Grade View: Grade I Tube type: Oral Tube size: 7.0 mm Number of attempts: 1 Airway Equipment and Method: Stylet Placement Confirmation: ETT inserted through vocal cords under direct vision,  positive ETCO2 and breath sounds checked- equal and bilateral Secured at: 21 cm Tube secured with: Tape Dental Injury: Teeth and Oropharynx as per pre-operative assessment

## 2019-11-06 NOTE — H&P (Addendum)
Reason for Consult/Chief Complaint: acute cholecystitis Consultant: Rhunette Croft, MD  Abigail Stuart is an 39 y.o. female.   HPI: 9F presents with acute onset of abdominal pain that began at 0500 on 11/05/2019, that progressed to also include nausea and vomiting around 1100. Localizes pain to epigastrium and b/l upper quadrants with some radiation to the back. No radiation to the shoulder. Tried forced emesis and bowel movements without relief. Also tried famotidine without relief. No prior episodes of pain like this in the past. Initially presented to urgent care who referred her to the ED.   No past medical history on file.  Past Surgical History:  Procedure Laterality Date  . BUNIONECTOMY    . CESAREAN SECTION      No family history on file.  Social History:  reports that she has never smoked. She does not have any smokeless tobacco history on file. She reports current alcohol use. She reports that she does not use drugs.  Allergies: No Known Allergies  Medications: I have reviewed the patient's current medications.  Results for orders placed or performed during the hospital encounter of 11/05/19 (from the past 48 hour(s))  Lipase, blood     Status: None   Collection Time: 11/05/19 11:32 AM  Result Value Ref Range   Lipase 27 11 - 51 U/L    Comment: Performed at [facility], [address] [place], [place] [address]  Comprehensive metabolic panel     Status: Abnormal   Collection Time: 11/05/19 11:32 AM  Result Value Ref Range   Sodium 139 135 - 145 mmol/L   Potassium 4.0 3.5 - 5.1 mmol/L   Chloride 104 98 - 111 mmol/L   CO2 25 22 - 32 mmol/L   Glucose, Bld 108 (H) 70 - 99 mg/dL    Comment: Glucose reference range applies only to samples taken after fasting for at least 8 hours.   BUN 9 6 - 20 mg/dL   Creatinine, Ser 5.00 0.44 - 1.00 mg/dL   Calcium 9.2 8.9 - 93.8 mg/dL   Total Protein 8.2 (H) 6.5 - 8.1 g/dL   Albumin 4.2 3.5 - 5.0 g/dL   AST 18  15 - 41 U/L   ALT 19 0 - 44 U/L   Alkaline Phosphatase 76 38 - 126 U/L   Total Bilirubin 0.3 0.3 - 1.2 mg/dL   GFR calc non Af Amer >60 >60 mL/min   GFR calc Af Amer >60 >60 mL/min   Anion gap 10 5 - 15    Comment: Performed at [facility], [address], [place], [place] [address]  CBC     Status: None   Collection Time: 11/05/19 11:32 AM  Result Value Ref Range   WBC 6.3 4.0 - 10.5 K/uL   RBC 4.56 3.87 - 5.11 MIL/uL   Hemoglobin 12.4 12.0 - 15.0 g/dL   HCT 37.1 36 - 46 %   MCV 82.7 80.0 - 100.0 fL   MCH 27.2 26.0 - 34.0 pg   MCHC 32.9 30.0 - 36.0 g/dL   RDW 69.6 78.9 - 38.1 %   Platelets 257 150 - 400 K/uL   nRBC 0.0 0.0 - 0.2 %    Comment: Performed at [facility], [address] [place], [place] [address]  Urinalysis, Routine w reflex microscopic Urine, Clean Catch     Status: Abnormal   Collection Time: 11/05/19 11:41 AM  Result Value Ref Range   Color, Urine YELLOW  YELLOW   APPearance CLEAR CLEAR   Specific Gravity, Urine 1.012 1.005 - 1.030   pH 6.0 5.0 - 8.0   Glucose, UA NEGATIVE NEGATIVE mg/dL   Hgb urine dipstick LARGE (A) NEGATIVE   Bilirubin Urine NEGATIVE NEGATIVE   Ketones, ur NEGATIVE NEGATIVE mg/dL   Protein, ur NEGATIVE NEGATIVE mg/dL   Nitrite NEGATIVE NEGATIVE   Leukocytes,Ua TRACE (A) NEGATIVE   RBC / HPF 0-5 0 - 5 RBC/hpf   WBC, UA 0-5 0 - 5 WBC/hpf   Bacteria, UA RARE (A) NONE SEEN   Squamous Epithelial / LPF 0-5 0 - 5    Comment: Performed at [facility], [address], [place], [place] [address]  POC urine preg, ED (not at [facility])     Status: None   Collection Time: 11/05/19 11:50 AM  Result Value Ref Range   Preg Test, Ur NEGATIVE NEGATIVE    Comment:        THE SENSITIVITY OF THIS METHODOLOGY IS >24 mIU/mL     CT ABDOMEN PELVIS W CONTRAST  Result Date: 11/05/2019 CLINICAL DATA:  Nausea, vomiting, epigastric pain. EXAM: CT ABDOMEN AND PELVIS WITH CONTRAST TECHNIQUE: Multidetector  CT imaging of the abdomen and pelvis was performed using the standard protocol following bolus administration of intravenous contrast. CONTRAST:  OMNIPAQUE IOHEXOL 300 MG/ML  SOLN COMPARISON:  None. FINDINGS: Lower chest: No acute abnormality. Hepatobiliary: No focal liver abnormality is seen. Questionable gallbladder wall thickening. No pericholecystic fluid. No bile duct dilatation. Pancreas: Unremarkable. No pancreatic ductal dilatation or surrounding inflammatory changes. Spleen: Normal in size without focal abnormality. Adrenals/Urinary Tract: Adrenal glands appear normal. Kidneys are unremarkable without mass, stone or hydronephrosis. No perinephric fluid. No ureteral or bladder calculi are identified. Bladder appears normal. Stomach/Bowel: No dilated large or small bowel loops. No evidence of bowel wall inflammation. Appendix is normal. Stomach is unremarkable, partially decompressed. Vascular/Lymphatic: No significant vascular findings are present. No enlarged abdominal or pelvic lymph nodes. Reproductive: Uterus and bilateral adnexa are unremarkable. Other: No free fluid or abscess collection. No free intraperitoneal air. Musculoskeletal: No acute or significant osseous findings. IMPRESSION: 1. Questionable gallbladder wall thickening. Recommend further characterization with right upper quadrant ultrasound. 2. Remainder of the abdomen and pelvis CT is unremarkable. No bowel obstruction or evidence of bowel wall inflammation. No evidence of acute solid organ abnormality. No renal or ureteral calculi. Appendix is normal. Electronically Signed   By: Bary Richard M.D.   On: 11/05/2019 21:21   US Abdomen Limited RUQ  Result Date: 11/05/2019 CLINICAL DATA:  Right upper quadrant abdominal pain EXAM: ULTRASOUND ABDOMEN LIMITED RIGHT UPPER QUADRANT COMPARISON:  None. FINDINGS: Gallbladder: The gallbladder contains multiple shadowing gallstones. The majority of these are mobile, however, an 11 mm stone is  seen impacted within the gallbladder neck. The gallbladder is mildly distended, there is mild gallbladder wall thickening noted, and trace pericholecystic fluid. The sonographic Eulah Pont sign is reportedly positive. Altogether, the findings are in keeping with changes of acute calculus cholecystitis. Common bile duct: Diameter: 4 mm in proximal diameter Liver: No focal lesion identified. Within normal limits in parenchymal echogenicity. Portal vein is patent on color Doppler imaging with normal direction of blood flow towards the liver. Other: No ascites IMPRESSION: Acute calculus cholecystitis. Electronically Signed   By: Helyn Numbers MD   On: 11/05/2019 22:38    ROS 10 point review of systems is negative except as listed above in HPI.   Physical Exam Blood pressure 101/64, pulse 64,  temperature 98.2 F (36.8 C), temperature source Oral, resp. rate 18, height 5\' 3"  (1.6 m), weight 111 kg, SpO2 100 %. Constitutional: well-developed, well-nourished HEENT: pupils equal, round, reactive to light, 27mm b/l, moist conjunctiva, external inspection of ears and nose normal, hearing intact Oropharynx: normal oropharyngeal mucosa, normal dentition Neck: no thyromegaly, trachea midline, no midline cervical tenderness to palpation Chest: breath sounds equal bilaterally, normal respiratory effort, no midline or lateral chest wall tenderness to palpation/deformity Abdomen: soft, epigastric and RUQ TTP, no bruising, no hepatosplenomegaly GU: normal female genitalia  Back: no wounds, no thoracic/lumbar spine tenderness to palpation, no thoracic/lumbar spine stepoffs Rectal: deferred Extremities: 2+ radial and pedal pulses bilaterally, motor and sensation intact to bilateral UE and LE, no peripheral edema MSK: normal gait/station, no clubbing/cyanosis of fingers/toes, normal ROM of all four extremities Skin: warm, dry, no rashes Psych: normal memory, normal mood/affect    Assessment/Plan: 29F with  radiographic and clinical evidence of acute cholecystitis.   To OR for lap chole 9/6. Plan for discharge from PACU. Informed consent was obtained after detailed explanation of risks, including bleeding, infection, biloma, need for IOC to delineate anatomy, and need for conversion to open procedure. All questions answered to the patient's satisfaction. Point of contact is 11/6, [phone], cousin, to be called after surgery.    [phone], MD General and Trauma Surgery [facility]

## 2019-11-06 NOTE — Transfer of Care (Signed)
Immediate Anesthesia Transfer of Care Note  Patient: Abigail Stuart  Procedure(s) Performed: LAPAROSCOPIC CHOLECYSTECTOMY WITH INTRAOPERATIVE CHOLANGIOGRAM (N/A Abdomen)  Patient Location: PACU  Anesthesia Type:General  Level of Consciousness: drowsy and patient cooperative  Airway & Oxygen Therapy: Patient Spontanous Breathing and Patient connected to face mask oxygen  Post-op Assessment: Report given to RN and Post -op Vital signs reviewed and stable  Post vital signs: Reviewed and stable  Last Vitals:  Vitals Value Taken Time  BP 135/79 11/06/19 0910  Temp    Pulse 68 11/06/19 0913  Resp 12 11/06/19 0913  SpO2 98 % 11/06/19 0913  Vitals shown include unvalidated device data.  Last Pain:  Vitals:   11/06/19 0259  TempSrc:   PainSc: 0-No pain         Complications: No complications documented.

## 2019-11-06 NOTE — Progress Notes (Signed)
D/C instructions given to patient. Patient had no questions. NT or writer will wheel patient out once patient ride is here

## 2019-11-06 NOTE — Anesthesia Postprocedure Evaluation (Signed)
Anesthesia Post Note  Patient: Abigail Stuart  Procedure(s) Performed: LAPAROSCOPIC CHOLECYSTECTOMY WITH INTRAOPERATIVE CHOLANGIOGRAM (N/A Abdomen)     Patient location during evaluation: PACU Anesthesia Type: General Level of consciousness: awake and sedated Pain management: pain level not controlled Vital Signs Assessment: post-procedure vital signs reviewed and stable Respiratory status: spontaneous breathing Cardiovascular status: stable Postop Assessment: no apparent nausea or vomiting Anesthetic complications: no   No complications documented.  Last Vitals:  Vitals:   11/06/19 0930 11/06/19 0935  BP: 117/74   Pulse: 61 (!) 55  Resp: 11 17  Temp:    SpO2: 91% 97%    Last Pain:  Vitals:   11/06/19 0930  TempSrc:   PainSc: 0-No pain                 Caren Macadam

## 2019-11-06 NOTE — ED Notes (Signed)
Report called to floor

## 2019-11-06 NOTE — Anesthesia Preprocedure Evaluation (Signed)
Anesthesia Evaluation  Patient identified by MRN, date of birth, ID band Patient awake    Reviewed: Allergy & Precautions, NPO status , Patient's Chart, lab work & pertinent test results  Airway Mallampati: I       Dental no notable dental hx. (+) Teeth Intact   Pulmonary asthma ,    Pulmonary exam normal breath sounds clear to auscultation       Cardiovascular negative cardio ROS Normal cardiovascular exam Rhythm:Regular Rate:Normal     Neuro/Psych negative neurological ROS  negative psych ROS   GI/Hepatic negative GI ROS, Neg liver ROS,   Endo/Other  Morbid obesity  Renal/GU negative Renal ROS  negative genitourinary   Musculoskeletal negative musculoskeletal ROS (+)   Abdominal (+) + obese,   Peds negative pediatric ROS (+)  Hematology negative hematology ROS (+)   Anesthesia Other Findings   Reproductive/Obstetrics negative OB ROS                             Anesthesia Physical Anesthesia Plan  ASA: III  Anesthesia Plan: General   Post-op Pain Management:    Induction:   PONV Risk Score and Plan: 4 or greater and Ondansetron, Dexamethasone and Midazolam  Airway Management Planned: Oral ETT  Additional Equipment: None  Intra-op Plan:   Post-operative Plan: Extubation in OR  Informed Consent: I have reviewed the patients History and Physical, chart, labs and discussed the procedure including the risks, benefits and alternatives for the proposed anesthesia with the patient or authorized representative who has indicated his/her understanding and acceptance.     Dental advisory given  Plan Discussed with: CRNA  Anesthesia Plan Comments:         Anesthesia Quick Evaluation

## 2019-11-06 NOTE — Op Note (Signed)
   Operative Note  Date: 11/06/2019  Procedure: laparoscopic cholecystectomy  Pre-op diagnosis: acute cholecystitis Post-op diagnosis: Acute calculous cholecystitis  Indication and clinical history: The patient is a 39 y.o. year old female with acute cholecystitis by imaging and clinical exam  Surgeon: Diamantina Monks, MD  Anesthesiologist: Arby Barrette, MD Anesthesia: General  Findings:  . Specimen: gallbladder . EBL: <5cc . Drains/Implants: none  Disposition: PACU - hemodynamically stable.  Description of procedure: The patient was positioned supine on the operating room table. Time-out was performed verifying correct patient, procedure, signature of informed consent, and administration of pre-operative antibiotics. General anesthetic induction and intubation were uneventful. The abdomen was prepped and draped in the usual sterile fashion. An infra-umbilical incision was made using an open technique using zero vicryl stay sutures on either side of the fascia and a 12mm Hassan port inserted. After establishing pneumoperitoneum, which the patient tolerated well, the abdominal cavity was inspected and no injury of any intra-abdominal structures was identified. Additional ports were placed under direct visualization and using local anesthetic: two 93mm ports in the right subcostal region and a 108mm port in the epigastric region. The patient was re-positioned to reverse Trendelenburg and right side up. The gallbladder was then retracted cephalad. The infundibulum was identified and retracted toward the right lower quadrant. The peritoneum was incised over the infundibulum and the triangle of Calot dissected. The cystic duct was clearly identified and doubly clipped and ligated. The cystic artery was diminutive and appeared to be two branches, which were both clipped and ligated. The gallbladder was dissected off the liver bed using electrocautery and hemostasis of the liver bed was confirmed prior to  separation of the final peritoneal attachments of the gallbladder to the liver bed. The gallbladder fossa was suctioned. After transection of the final peritoneal attachments, the gallbladder was placed in an endoscopic specimen retrieval bag, removed via the umbilical port site, and sent to pathology as a frozen specimen. The gallbladder fossa was inspected confirming hemostasis, the absence of bile leakage from the cystic duct stump, and correct placement of clips on the cystic artery and cystic duct stumps. The abdomen was desufflated and the fascia of the umbilical port site was closed using the previously placed stay sutures. Additional local anesthetic was administered at the umbilical port site.  The skin of all incisions was closed with 4-0 monocryl. Sterile dressings were applied. All sponge and instrument counts were correct at the conclusion of the procedure. The patient was awakened from anesthesia, extubated uneventfully, and transported to the PACU - hemodynamically stable.. There were no complications.    Diamantina Monks, MD General and Trauma Surgery [facility] Surgery

## 2019-11-07 ENCOUNTER — Encounter (HOSPITAL_COMMUNITY): Payer: Self-pay | Admitting: Surgery

## 2019-11-08 LAB — SURGICAL PATHOLOGY

## 2019-11-14 NOTE — Discharge Summary (Signed)
    Patient ID: Abigail Stuart [id number] 1980/10/24 39 y.o.  Admit date: 11/05/2019 Discharge date: 11/14/2019  Admitting Diagnosis: Cholecystitis  Discharge Diagnosis Patient Active Problem List   Diagnosis Date Noted  . Acute cholecystitis 11/05/2019    Consultants none  Reason for Admission: cholecystitis  Procedures Lap chole  Hospital Course:  Uncomplicated    Physical Exam: Gen: comfortable, no distress Neuro: non-focal exam HEENT: PERRL Neck: supple CV: RRR Pulm: unlabored breathing Abd: soft, appropriately TTP GU: clear yellow urine Extr: wwp, no edema   Allergies as of 11/06/2019   No Known Allergies     Medication List    TAKE these medications   albuterol 108 (90 Base) MCG/ACT inhaler Commonly known as: VENTOLIN HFA Inhale 2 puffs into the lungs every 6 (six) hours as needed for wheezing or shortness of breath.   cetirizine 10 MG tablet Commonly known as: ZYRTEC Take 10 mg by mouth daily as needed for allergies.   diphenhydrAMINE 25 MG tablet Commonly known as: BENADRYL Take 25 mg by mouth at bedtime as needed for allergies.   ferrous sulfate 325 (65 FE) MG tablet Take 325 mg by mouth daily with breakfast.   gabapentin 300 MG capsule Commonly known as: NEURONTIN Take 300 mg by mouth at bedtime as needed (nerve pain).   HAIR/SKIN/NAILS/BIOTIN PO Take 1-2 tablets by mouth See admin instructions. Take 2 tablets by mouth every morning and 1 tablet at night   ibuprofen 800 MG tablet Commonly known as: ADVIL Take 800 mg by mouth daily as needed for headache or cramping.   montelukast 10 MG tablet Commonly known as: SINGULAIR Take 10 mg by mouth at bedtime as needed (allergies).   multivitamin with minerals Tabs tablet Take 1-2 tablets by mouth See admin instructions. Take 2 tablets by mouth every morning and 1 tablet at night   oxyCODONE 5 MG immediate release tablet Commonly known as: Roxicodone Take 1 tablet (5 mg total) by  mouth every 6 (six) hours as needed for severe pain. Alternate doses with tylenol/ibuprofen.         Follow-up Information    Diamantina Monks, MD In 1 week.   Specialty: Surgery Why: For wound re-check Contact information: [address] [place] [address] [phone]                Signed: Diamantina Monks, MD [facility] Surgery 11/14/2019, 11:50 AM

## 2019-12-11 ENCOUNTER — Ambulatory Visit (HOSPITAL_COMMUNITY)
Admission: EM | Admit: 2019-12-11 | Discharge: 2019-12-11 | Disposition: A | Discharge status: Home / Self Care | Payer: BC Managed Care – PPO | Attending: Physician Assistant | Admitting: Physician Assistant

## 2019-12-11 ENCOUNTER — Encounter (HOSPITAL_COMMUNITY): Payer: Self-pay | Admitting: Emergency Medicine

## 2019-12-11 ENCOUNTER — Other Ambulatory Visit: Payer: Self-pay

## 2019-12-11 DIAGNOSIS — M545 Low back pain, unspecified: Secondary | ICD-10-CM | POA: Diagnosis not present

## 2019-12-11 DIAGNOSIS — R109 Unspecified abdominal pain: Secondary | ICD-10-CM | POA: Diagnosis not present

## 2019-12-11 LAB — POCT URINALYSIS DIPSTICK, ED / UC
Bilirubin Urine: NEGATIVE
Glucose, UA: NEGATIVE mg/dL
Hgb urine dipstick: NEGATIVE
Ketones, ur: NEGATIVE mg/dL
Nitrite: NEGATIVE
Protein, ur: NEGATIVE mg/dL
Specific Gravity, Urine: 1.02 (ref 1.005–1.030)
Urobilinogen, UA: 0.2 mg/dL (ref 0.0–1.0)
pH: 5.5 (ref 5.0–8.0)

## 2019-12-11 NOTE — Discharge Instructions (Addendum)
Recommend follow up with surgeon or GI specialist  Can take ibuprofen as needed for back pain.

## 2019-12-11 NOTE — ED Provider Notes (Signed)
[facility]    CSN: [id number] Arrival date & time: 12/11/19  1736      History   Chief Complaint Chief Complaint  Patient presents with  . Abdominal Pain    HPI Abigail Stuart is a 39 y.o. female.   Pt complains of pain around her umbilicus s/p cholecystectomy 11/06/2019.  She also complains of right flank pain that started after surgery.  Reports persistent nausea since surgery with good relief from prn Zofran.  Denies vomiting, fever, chills.  She has had some constipation since surgery which is improving with colace and miralax.  She has not yet had her follow up with surgeon since surgery.       History reviewed. No pertinent past medical history.  Patient Active Problem List   Diagnosis Date Noted  . Acute cholecystitis 11/05/2019    Past Surgical History:  Procedure Laterality Date  . BUNIONECTOMY    . CESAREAN SECTION    . CHOLECYSTECTOMY N/A 11/06/2019   Procedure: LAPAROSCOPIC CHOLECYSTECTOMY WITH INTRAOPERATIVE CHOLANGIOGRAM;  Surgeon: Diamantina Monks, MD;  Location: [facility];  Service: General;  Laterality: N/A;    OB History    Gravida  1   Para      Term      Preterm      AB      Living        SAB      TAB      Ectopic      Multiple      Live Births               Home Medications    Prior to Admission medications   Medication Sig Start Date End Date Taking? Authorizing Provider  albuterol (VENTOLIN HFA) 108 (90 Base) MCG/ACT inhaler Inhale 2 puffs into the lungs every 6 (six) hours as needed for wheezing or shortness of breath.    [provider]  cetirizine (ZYRTEC) 10 MG tablet Take 10 mg by mouth daily as needed for allergies.     [provider]  diphenhydrAMINE (BENADRYL) 25 MG tablet Take 25 mg by mouth at bedtime as needed for allergies.    [provider]  ferrous sulfate 325 (65 FE) MG tablet Take 325 mg by mouth daily with breakfast.    [provider]  gabapentin  (NEURONTIN) 300 MG capsule Take 300 mg by mouth at bedtime as needed (nerve pain).    [provider]  ibuprofen (ADVIL) 800 MG tablet Take 800 mg by mouth daily as needed for headache or cramping.    [provider]  montelukast (SINGULAIR) 10 MG tablet Take 10 mg by mouth at bedtime as needed (allergies).  05/29/15   [provider]  Multiple Vitamin (MULTIVITAMIN WITH MINERALS) TABS tablet Take 1-2 tablets by mouth See admin instructions. Take 2 tablets by mouth every morning and 1 tablet at night    [provider]  Multiple Vitamins-Minerals (HAIR/SKIN/NAILS/BIOTIN PO) Take 1-2 tablets by mouth See admin instructions. Take 2 tablets by mouth every morning and 1 tablet at night    [provider]  oxyCODONE (ROXICODONE) 5 MG immediate release tablet Take 1 tablet (5 mg total) by mouth every 6 (six) hours as needed for severe pain. Alternate doses with tylenol/ibuprofen. 11/06/19 11/05/20  Abigail Miyamoto, MD    Family History History reviewed. No pertinent family history.  Social History Social History   Tobacco Use  . Smoking status: Never Smoker  . Smokeless  tobacco: Never Used  Substance Use Topics  . Alcohol use: Yes    Comment: socially  . Drug use: No     Allergies   Patient has no known allergies.   Review of Systems Review of Systems  Constitutional: Negative for chills and fever.  HENT: Negative for ear pain and sore throat.   Eyes: Negative for pain and visual disturbance.  Respiratory: Negative for cough and shortness of breath.   Cardiovascular: Negative for chest pain and palpitations.  Gastrointestinal: Positive for abdominal pain (pain around surgical site at umbilicus), constipation and nausea. Negative for vomiting.  Genitourinary: Negative for dysuria and hematuria.  Musculoskeletal: Negative for arthralgias and back pain.  Skin: Negative for color change and rash.  Neurological: Negative for seizures and syncope.   All other systems reviewed and are negative.    Physical Exam Triage Vital Signs ED Triage Vitals  Enc Vitals Group     BP 12/11/19 1934 118/77     Pulse Rate 12/11/19 1934 61     Resp --      Temp 12/11/19 1934 98.3 F (36.8 C)     Temp Source 12/11/19 1934 Oral     SpO2 12/11/19 1934 100 %     Weight --      Height --      Head Circumference --      Peak Flow --      Pain Score 12/11/19 1932 7     Pain Loc --      Pain Edu? --      Excl. in GC? --    No data found.  Updated Vital Signs BP 118/77 (BP Location: Right Arm)   Pulse 61   Temp 98.3 F (36.8 C) (Oral)   LMP 11/05/2019   SpO2 100%   Visual Acuity Right Eye Distance:   Left Eye Distance:   Bilateral Distance:    Right Eye Near:   Left Eye Near:    Bilateral Near:     Physical Exam Vitals and nursing note reviewed.  Constitutional:      General: She is not in acute distress.    Appearance: She is well-developed.  HENT:     Head: Normocephalic and atraumatic.  Eyes:     Conjunctiva/sclera: Conjunctivae normal.  Cardiovascular:     Rate and Rhythm: Normal rate and regular rhythm.     Heart sounds: No murmur heard.   Pulmonary:     Effort: Pulmonary effort is normal. No respiratory distress.     Breath sounds: Normal breath sounds.  Abdominal:     Palpations: Abdomen is soft.     Tenderness: There is no abdominal tenderness. There is no right CVA tenderness or left CVA tenderness.     Comments: Well healing surgical wound at umbilicus with suture in place.  No redness, warmth, swelling, drainage noted.  No pain with palpation. Several well healed surgical wounds from laparoscopic cholecystectomy.    Musculoskeletal:     Cervical back: Neck supple.  Skin:    General: Skin is warm and dry.  Neurological:     Mental Status: She is alert.      UC Treatments / Results  Labs (all labs ordered are listed, but only abnormal results are displayed) Labs Reviewed - No data to  display  EKG   Radiology No results found.  Procedures Procedures (including critical care time)  Medications Ordered in UC Medications - No data to display  Initial Impression / Assessment and  Plan / UC Course  I have reviewed the triage vital signs and the nursing notes.  Pertinent labs & imaging results that were available during my care of the patient were reviewed by me and considered in my medical decision making (see chart for details).     UA normal.  Abdominal exam benign.  Assume pain around umbilicus is from surgical wound continuing to heal.  Advised pt to follow up with surgeon.  Can taken Ibuprofen for back pain, apply heat as needed. Continue with Zofran as needed for nausea.  Final Clinical Impressions(s) / UC Diagnoses   Final diagnoses:  None   Discharge Instructions   None    ED Prescriptions    None     PDMP not reviewed this encounter.   Jodell Cipro, PA-C 12/11/19 2006

## 2019-12-11 NOTE — ED Triage Notes (Signed)
Pt c/o RUQ abd pain x 1 week. She recently had a cholecystectomy. Pt states her pain is around her navel.

## 2020-11-07 IMAGING — US US ABDOMEN LIMITED
1 series · 14 of 25 positions shown · non-contrast
Comparison: None.

CLINICAL DATA: Right upper quadrant abdominal pain

EXAM:
ULTRASOUND ABDOMEN LIMITED RIGHT UPPER QUADRANT

[Series 1: us abdomen limited · 14 of 114 slices shown]
[im 1/114]
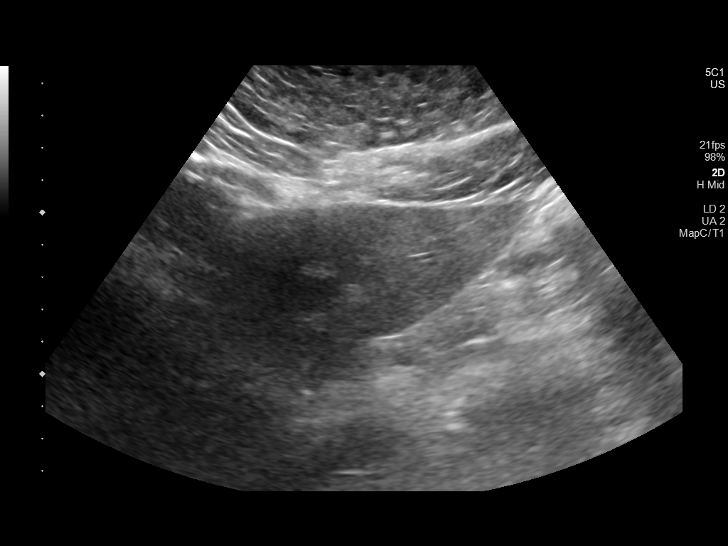
[im 10/114]
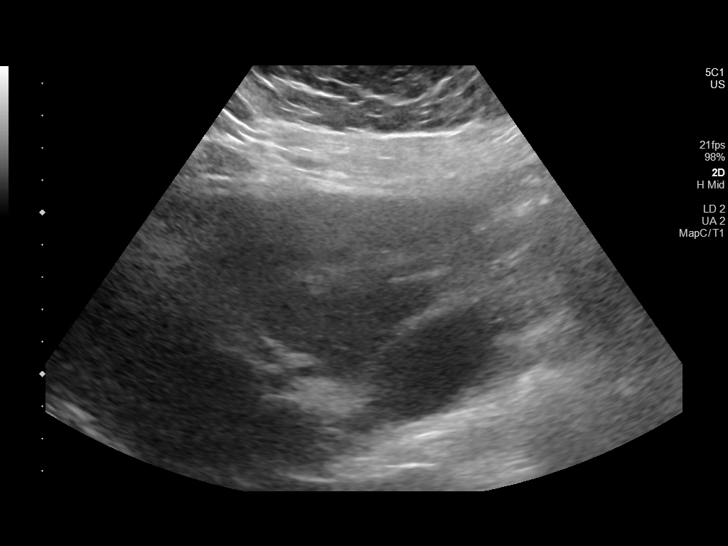
[im 19/114]
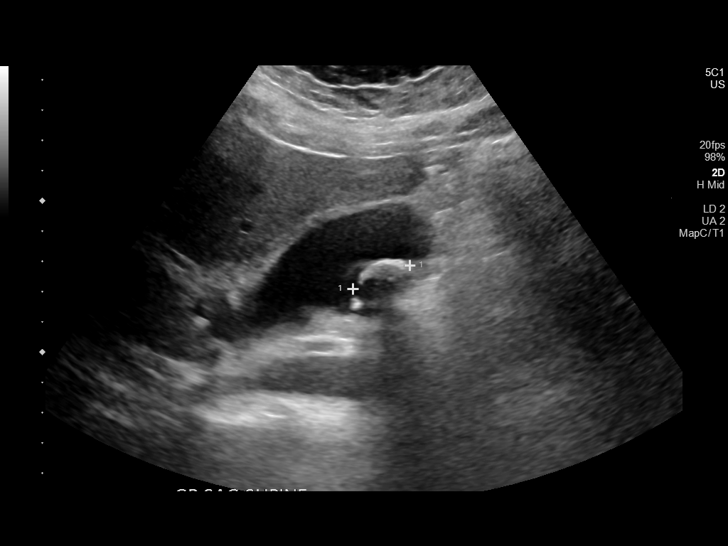
[im 29/114]
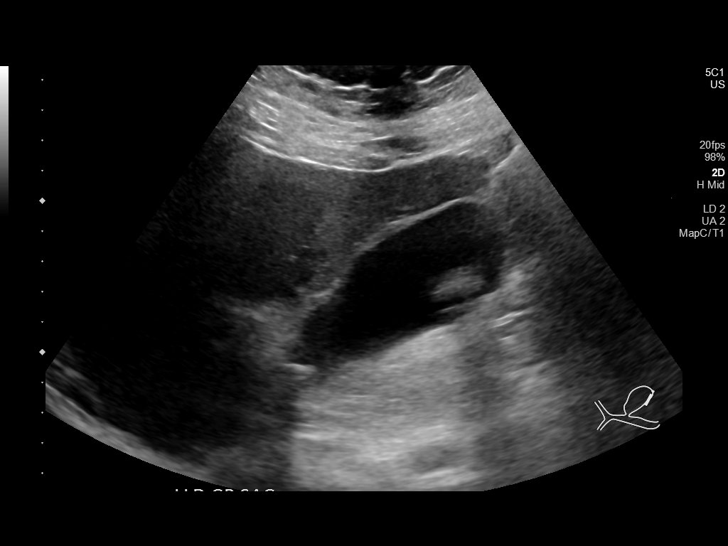
[im 38/114]
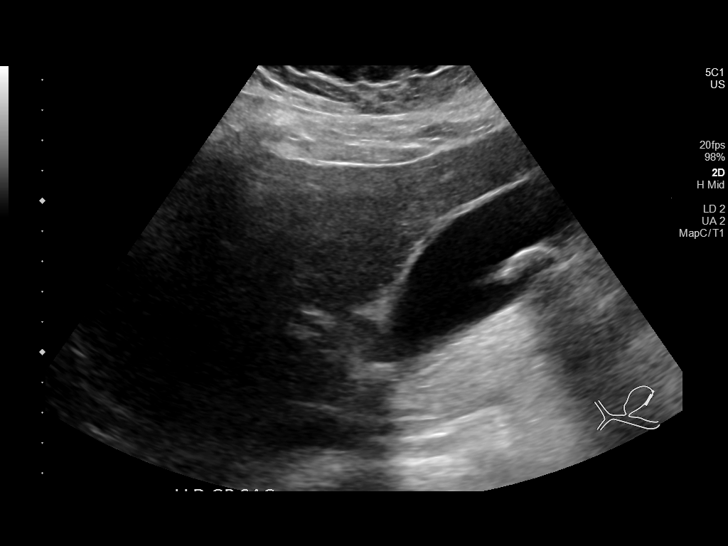
[im 43/114]
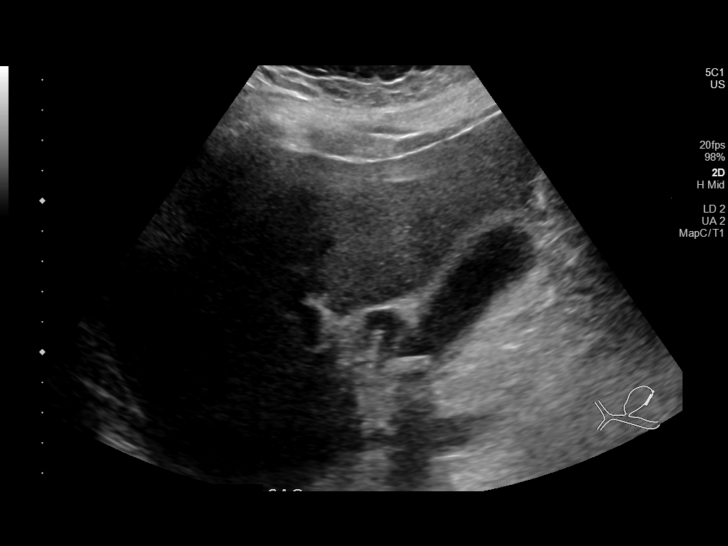
[im 52/114]
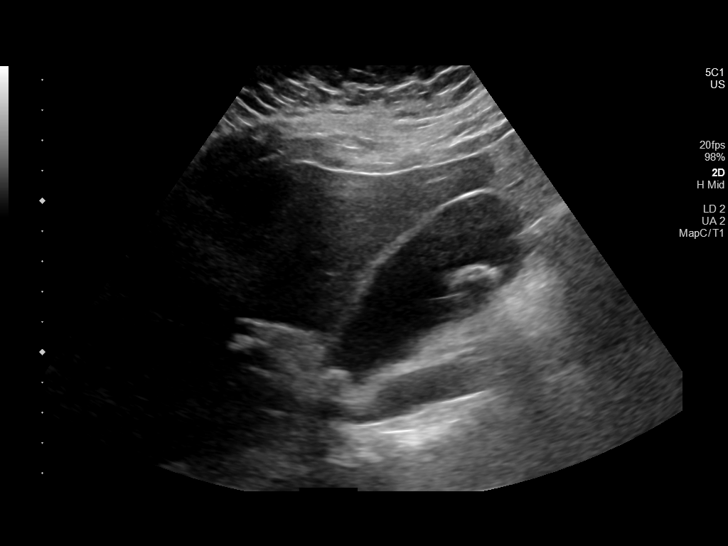
[im 62/114]
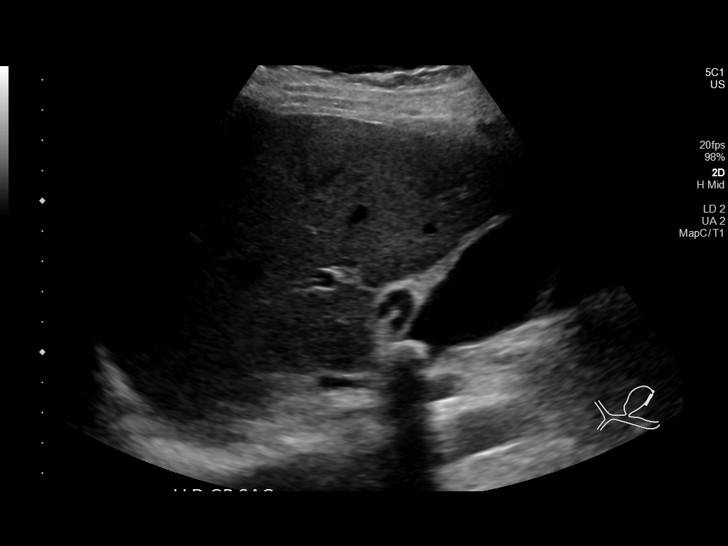
[im 71/114]
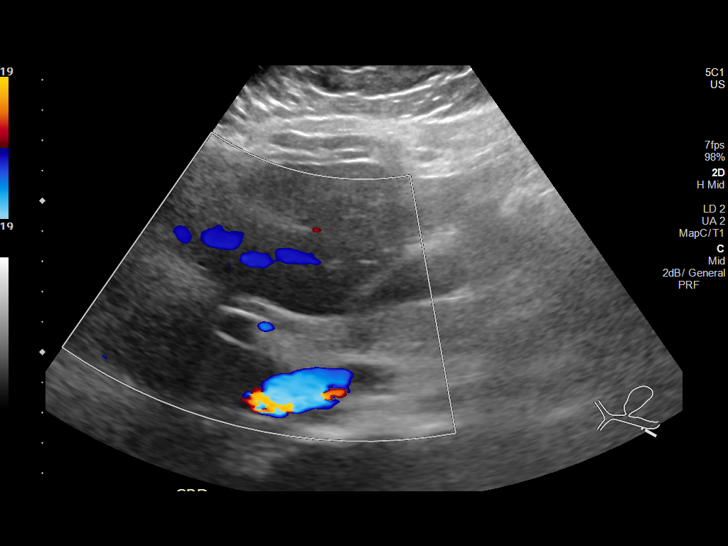
[im 76/114]
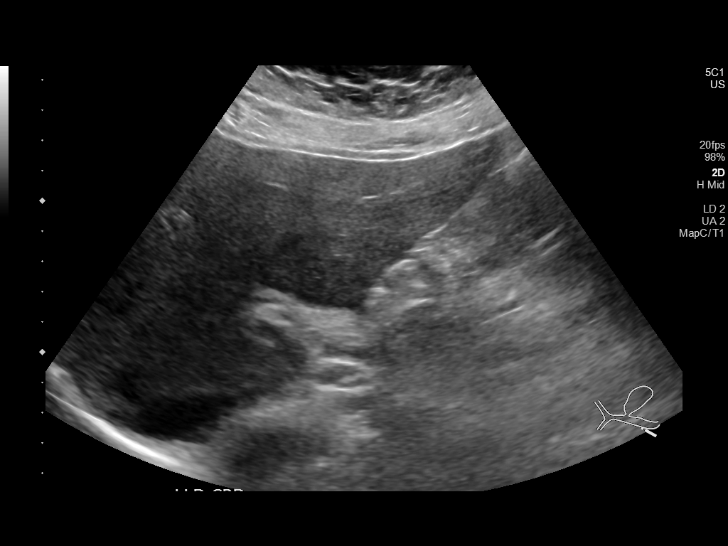
[im 85/114]
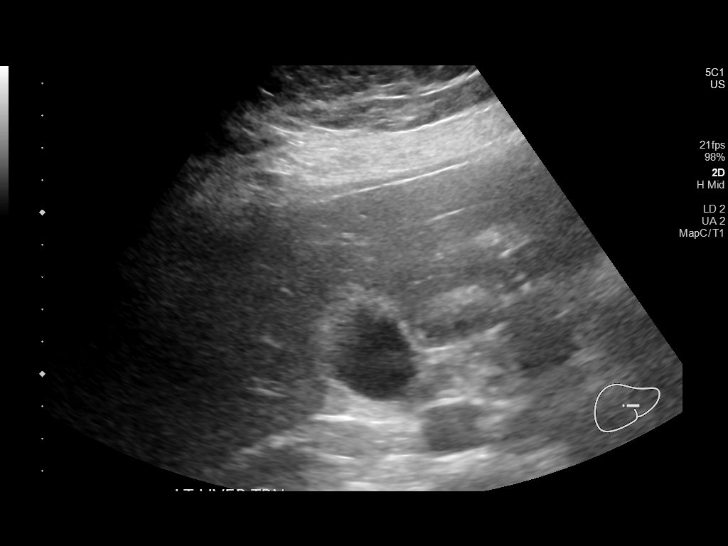
[im 95/114]
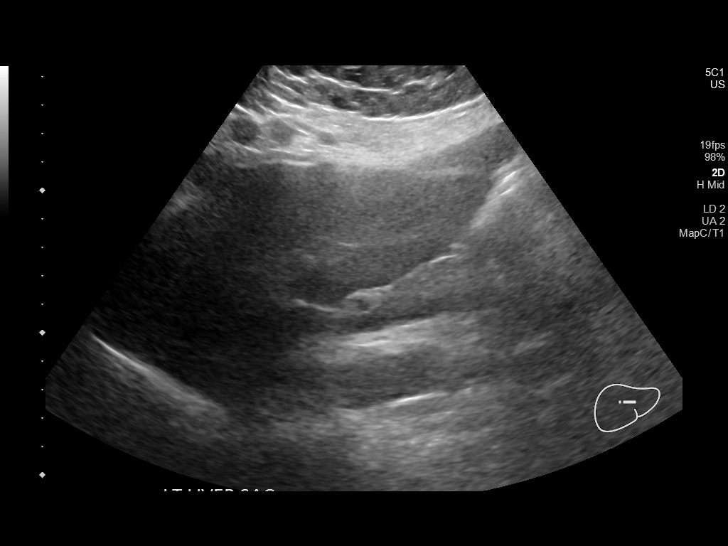
[im 104/114]
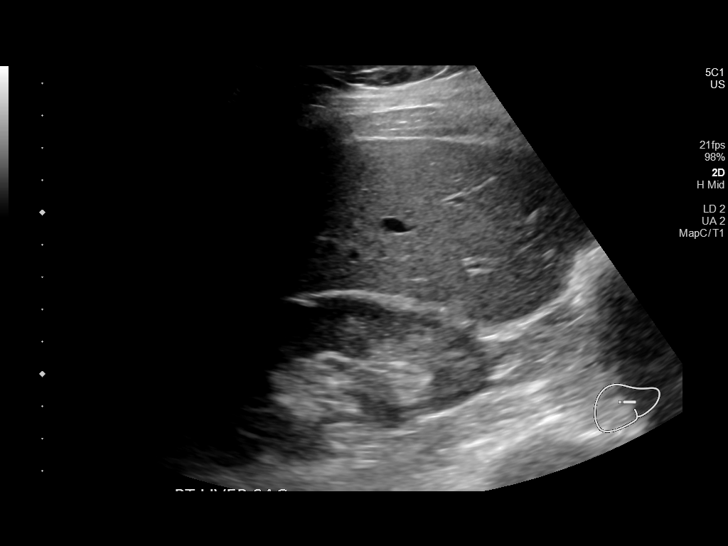
[im 114/114]
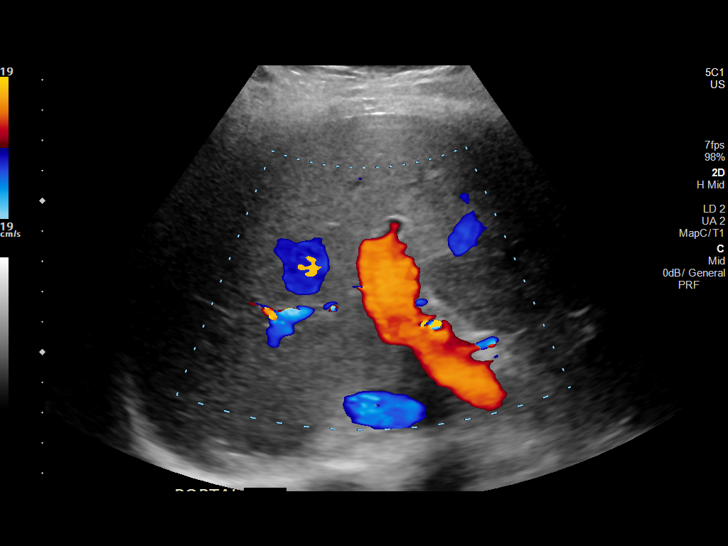

[14 of 25 positions shown; findings below may reference images not displayed]

FINDINGS: Gallbladder:

The gallbladder contains multiple shadowing gallstones. The majority
of these are mobile, however, an 11 mm stone is seen impacted within
the gallbladder neck. The gallbladder is mildly distended, there is
mild gallbladder wall thickening noted, and trace pericholecystic
fluid. The sonographic Murphy sign is reportedly positive.
Altogether, the findings are in keeping with changes of acute
calculus cholecystitis.

Common bile duct:

Diameter: 4 mm in proximal diameter

Liver:

No focal lesion identified. Within normal limits in parenchymal
echogenicity. Portal vein is patent on color Doppler imaging with
normal direction of blood flow towards the liver.

Other: No ascites
IMPRESSION: Acute calculus cholecystitis.

## 2020-12-08 DIAGNOSIS — Z03818 Encounter for observation for suspected exposure to other biological agents ruled out: Secondary | ICD-10-CM | POA: Diagnosis not present

## 2020-12-08 DIAGNOSIS — Z20822 Contact with and (suspected) exposure to covid-19: Secondary | ICD-10-CM | POA: Diagnosis not present

## 2020-12-28 DIAGNOSIS — N390 Urinary tract infection, site not specified: Secondary | ICD-10-CM | POA: Diagnosis not present

## 2021-01-05 DIAGNOSIS — N898 Other specified noninflammatory disorders of vagina: Secondary | ICD-10-CM | POA: Diagnosis not present

## 2021-01-10 DIAGNOSIS — G4700 Insomnia, unspecified: Secondary | ICD-10-CM | POA: Diagnosis not present

## 2021-01-10 DIAGNOSIS — E669 Obesity, unspecified: Secondary | ICD-10-CM | POA: Diagnosis not present

## 2021-01-10 DIAGNOSIS — F4323 Adjustment disorder with mixed anxiety and depressed mood: Secondary | ICD-10-CM | POA: Diagnosis not present

## 2021-01-10 DIAGNOSIS — G5603 Carpal tunnel syndrome, bilateral upper limbs: Secondary | ICD-10-CM | POA: Diagnosis not present

## 2021-03-29 DIAGNOSIS — F331 Major depressive disorder, recurrent, moderate: Secondary | ICD-10-CM | POA: Diagnosis not present

## 2021-03-31 DIAGNOSIS — H16043 Marginal corneal ulcer, bilateral: Secondary | ICD-10-CM | POA: Diagnosis not present

## 2021-04-09 DIAGNOSIS — U071 COVID-19: Secondary | ICD-10-CM | POA: Diagnosis not present

## 2021-04-09 DIAGNOSIS — R509 Fever, unspecified: Secondary | ICD-10-CM | POA: Diagnosis not present

## 2021-04-17 DIAGNOSIS — H20013 Primary iridocyclitis, bilateral: Secondary | ICD-10-CM | POA: Diagnosis not present

## 2021-04-17 DIAGNOSIS — Z0189 Encounter for other specified special examinations: Secondary | ICD-10-CM | POA: Diagnosis not present

## 2021-04-18 DIAGNOSIS — H20013 Primary iridocyclitis, bilateral: Secondary | ICD-10-CM | POA: Diagnosis not present

## 2021-04-19 DIAGNOSIS — H209 Unspecified iridocyclitis: Secondary | ICD-10-CM | POA: Diagnosis not present

## 2021-04-19 DIAGNOSIS — H5711 Ocular pain, right eye: Secondary | ICD-10-CM | POA: Diagnosis not present

## 2021-04-23 DIAGNOSIS — H20013 Primary iridocyclitis, bilateral: Secondary | ICD-10-CM | POA: Diagnosis not present

## 2021-04-29 DIAGNOSIS — H20013 Primary iridocyclitis, bilateral: Secondary | ICD-10-CM | POA: Diagnosis not present

## 2021-05-06 DIAGNOSIS — H20013 Primary iridocyclitis, bilateral: Secondary | ICD-10-CM | POA: Diagnosis not present

## 2021-05-17 DIAGNOSIS — N760 Acute vaginitis: Secondary | ICD-10-CM | POA: Diagnosis not present

## 2021-05-20 DIAGNOSIS — H20013 Primary iridocyclitis, bilateral: Secondary | ICD-10-CM | POA: Diagnosis not present

## 2021-06-27 DIAGNOSIS — D571 Sickle-cell disease without crisis: Secondary | ICD-10-CM | POA: Diagnosis not present

## 2021-06-27 DIAGNOSIS — H35413 Lattice degeneration of retina, bilateral: Secondary | ICD-10-CM | POA: Diagnosis not present

## 2021-07-23 DIAGNOSIS — N760 Acute vaginitis: Secondary | ICD-10-CM | POA: Diagnosis not present

## 2021-08-01 NOTE — Progress Notes (Unsigned)
.  Office Visit Note  Patient: Abigail Stuart             Date of Birth: 01/29/1981           MRN: [id number]             PCP: Wendie Simmer, MD Referring: Calvert Cantor, MD Visit Date: 08/15/2021 Occupation: _0 @  Subjective:  Uveitis, elevated sedimentation rate  History of Present Illness: Abigail Stuart is a 41 y.o. female seen in consultation per request of Dr. Bing Plume.  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  She was seen by hand specialist and was diagnosed with carpal tunnel syndrome.  The symptoms resolved over the years.  She states in 2019 she started having stiffness in her bilateral hands and burning sensation in her hands.  She had gabapentin which she took for her hand pain and that relieves some of the discomfort.  She also started wearing a carpal tunnel brace.  In December 2022 she developed red eyes for which she was seen by her optometrist.  She states she was diagnosed with infection due to contact lens use.  She was going every 2 weeks without much improvement.  Eventually she developed severe redness in her eyes and blurred vision for which she went to the urgent care from there she was referred to [facility].  She recalls in February 2022 she was diagnosed with uveitis.  She was given doxycycline antibiotic tablets for about 2 months and then steroid eyedrops.  Finally her eye infection cleared up and her uveitis improved.  She is wearing contact lenses again.  She states she had some lab work done at Dr. Rachael Fee office which showed elevated C-reactive protein and sedimentation rate.  She continues to have some morning stiffness in her hands.  She describes pain in her bilateral.  She has not noticed any joint swelling.  She also has some discomfort in both hips especially at nighttime.  She tried changing her mattress.  She has pain and stiffness in her  bilateral knee joints.  None of the other joints are painful.  She notices fluid retention in her left lower extremity.  She denies any family history of autoimmune disease.  She is gravida 1, para 1, miscarriages 0.  There is no history of DVTs.  Activities of Daily Living:  Patient reports morning stiffness for 24 hours.   Patient Reports nocturnal pain.  Difficulty dressing/grooming: Denies Difficulty climbing stairs: Denies Difficulty getting out of chair: Denies Difficulty using hands for taps, buttons, cutlery, and/or writing: Reports  Review of Systems  Constitutional:  Positive for fatigue.  HENT:  Negative for mouth sores and mouth dryness.   Eyes:  Negative for dryness.  Respiratory:  Negative for shortness of breath.   Cardiovascular:  Positive for swelling in legs/feet.  Gastrointestinal:  Negative for blood in stool, constipation and diarrhea.  Endocrine: Positive for cold intolerance, excessive thirst and increased urination.  Genitourinary:  Negative for difficulty urinating.  Musculoskeletal:  Positive for joint pain, joint pain and morning stiffness.  Skin:  Positive for rash. Negative for color change and sensitivity to sunlight.  Allergic/Immunologic: Negative for susceptible to infections.  Neurological:  Positive for numbness and weakness.  Hematological:  Negative for bruising/bleeding tendency and swollen glands.  Psychiatric/Behavioral:  Positive for depressed mood and sleep disturbance. The patient is nervous/anxious.  PMFS History:  Patient Active Problem List   Diagnosis Date Noted   Uveitis 08/15/2021   Acute cholecystitis 11/05/2019    History reviewed. No pertinent past medical history.  History reviewed. No pertinent family history. Past Surgical History:  Procedure Laterality Date   BUNIONECTOMY     CESAREAN SECTION     CHOLECYSTECTOMY N/A 11/06/2019   Procedure: LAPAROSCOPIC CHOLECYSTECTOMY WITH INTRAOPERATIVE CHOLANGIOGRAM;  Surgeon: Jesusita Oka, MD;  Location: [facility];  Service: General;  Laterality: N/A;   Social History   Social History Narrative   Not on file   Immunization History  Administered Date(s) Administered   PFIZER(Purple Top)SARS-COV-2 Vaccination 06/04/2019, 06/24/2019     Objective: Vital Signs: BP 110/70 (BP Location: Right Arm, Patient Position: Sitting, Cuff Size: Normal)   Pulse (!) 47   Resp 15   Ht 5' 3" (1.6 m)   Wt 220 lb (99.8 kg)   BMI 38.97 kg/m    Physical Exam Vitals and nursing note reviewed.  Constitutional:      Appearance: She is well-developed.  HENT:     Head: Normocephalic and atraumatic.  Eyes:     Conjunctiva/sclera: Conjunctivae normal.  Cardiovascular:     Rate and Rhythm: Normal rate and regular rhythm.     Heart sounds: Normal heart sounds.  Pulmonary:     Effort: Pulmonary effort is normal.     Breath sounds: Normal breath sounds.  Abdominal:     General: Bowel sounds are normal.     Palpations: Abdomen is soft.  Musculoskeletal:     Cervical back: Normal range of motion.  Lymphadenopathy:     Cervical: No cervical adenopathy.  Skin:    General: Skin is warm and dry.     Capillary Refill: Capillary refill takes less than 2 seconds.  Neurological:     Mental Status: She is alert and oriented to person, place, and time.  Psychiatric:        Behavior: Behavior normal.      Musculoskeletal Exam: C-spine thoracic and lumbar spine were in good range of motion.  She had tenderness on palpation over SI joints.  She also had tenderness over bilateral trochanteric bursa.  She she had painful range of motion of her right shoulder joint.  Left shoulder joint was in full range of motion.  She had no tenderness over elbows or wrist joints.  Wrist joints, MCPs PIPs and DIPs with good range of motion with no synovitis and no tenderness.  Hip joints and knee joints with good range of motion without any warmth swelling or effusion.  There is thickening of her left ankle  joint.  She had bilateral breasts planus and bunionectomy scar from previous surgeries.  CDAI Exam: CDAI Score: -- Patient Global: --; Provider Global: -- Swollen: --; Tender: -- Joint Exam 08/15/2021   No joint exam has been documented for this visit   There is currently no information documented on the homunculus. Go to the Rheumatology activity and complete the homunculus joint exam.  Investigation: No additional findings.  Imaging: No results found.  Recent Labs: Lab Results  Component Value Date   WBC 8.9 11/06/2019   HGB 12.1 11/06/2019   PLT 168 11/06/2019   NA 141 11/06/2019   K 4.4 11/06/2019   CL 109 11/06/2019   CO2 22 11/06/2019   GLUCOSE 112 (H) 11/06/2019   BUN 5 (L) 11/06/2019   CREATININE 0.73 11/06/2019   BILITOT 0.3 11/05/2019   ALKPHOS 76 11/05/2019  AST 18 11/05/2019   ALT 19 11/05/2019   PROT 8.2 (H) 11/05/2019   ALBUMIN 4.2 11/05/2019   CALCIUM 8.6 (L) 11/06/2019   GFRAA >60 11/06/2019    Speciality Comments: No specialty comments available. April 18, 2021 chest x-ray was negative.  Her PCP from Dr. Rachael Fee notes  Procedures:  No procedures performed Allergies: Patient has no known allergies.   Assessment / Plan:     Visit Diagnoses: Polyarthralgia -patient complaints of pain and stiffness in her hands over the last 10 years.  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  She is mostly typing and sitting at the desk now.  She states that she has discomfort in her wrist and her bilateral thumb.  She has not noticed any joint swelling.  She also has discomfort in her right shoulder right knee and her bilateral feet.  Plan: Cyclic citrul peptide antibody, IgG, C-reactive protein  Uveitis - Bilateral-patient started having symptoms of eye inflammation and December 2022.  She was diagnosed with  uveitis and April 09, 2021 by Dr. Bing Plume.  She also had infection at that time which was treated with doxycycline for 2 months.  She states she was given steroid eyedrops which were helpful.  She states the infection and uveitis has completely cleared up now.  She has been using contact lenses.  Her labs showed elevated C-reactive protein for that reason she was referred to me.  04/17/21: ANA negative, CRP 14, ACE 27, ESR 28, RPR-, Lyme ab-, RF<10 -I will obtain additional labs today.  Plan: CMV abs, IgG+IgM (cytomegalovirus), Epstein-Barr virus VCA antibody panel, HLA-B27 antigen, HSV(herpes simplex vrs) 1+2 ab-IgG, Toxoplasma gondii antibody, IgM, Varicella Zoster IgG and IgM  Pain in both hands -she complains of pain and discomfort in her bilateral hands.  No synovitis was noted.  Plan: XR Hand 2 View Right, XR Hand 2 View Left.  X-rays were consistent with early osteoarthritis.  Chronic pain of both knees -she complains of discomfort in her bilateral knee joints.  No warmth swelling or effusion was noted.  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  Pain in both feet -she has discomfort in her feet.  No synovitis was noted.  She had bunionectomy in the past.  She also had bilateral pes planus.  Plan: XR Foot 2 Views Right, XR Foot 2 Views Left.  X-rays showed postsurgical changes in bilateral first MTPs and some osteoarthritic changes.  Pes planus of both feet-using arch support was advised.  History of bunionectomy of both great toes-surgical scar was noted.  Chronic SI joint pain -she complains of lower back pain for many years.  She also takes gabapentin for lower back pain.  She had tenderness over the SI joints.  Plan: XR Pelvis 1-2 Views, no SI joint sclerosis or narrowing was noted.  HLA-B27 antigen  High risk medication use -in anticipation to start on immunosuppressive therapy I will obtain additional  labs today.  Plan: Hepatitis B core antibody, IgM, Hepatitis C antibody, Protein electrophoresis, serum, QuantiFERON-TB Gold Plus, COMPLETE METABOLIC PANEL WITH GFR, CBC with Differential/Platelet  Other fatigue-she gives history of fatigue for many years.  Vitamin D deficiency -she has history of vitamin D deficiency.  Plan: VITAMIN D 25 Hydroxy (Vit-D Deficiency, Fractures)  Anxiety and depression-currently not taking any medications.  Orders: Orders Placed This Encounter  Procedures   XR Hand 2 View Right   XR Hand 2 View Left   XR KNEE 3 VIEW RIGHT   XR KNEE 3 VIEW LEFT   XR Foot 2 Views Right   XR Foot 2 Views Left   XR Pelvis 1-2 Views   CMV abs, IgG+IgM (cytomegalovirus)   Cyclic citrul peptide antibody, IgG   Epstein-Barr virus VCA antibody panel   Hepatitis B core antibody, IgM   Hepatitis C antibody   HLA-B27 antigen   HSV(herpes simplex vrs) 1+2 ab-IgG   Protein electrophoresis, serum   QuantiFERON-TB Gold Plus   Toxoplasma gondii antibody, IgM   Varicella Zoster IgG and IgM   C-reactive protein   COMPLETE METABOLIC PANEL WITH GFR   CBC with Differential/Platelet   VITAMIN D 25 Hydroxy (Vit-D Deficiency, Fractures)   No orders of the defined types were placed in this encounter.    Follow-Up Instructions: Return for Uveitis, polyarthralgia.   Bo Merino, MD  Note - This record has been created using Editor, commissioning.  Chart creation errors have been sought, but may not always  have been located. Such creation errors do not reflect on  the standard of medical care.

## 2021-08-15 ENCOUNTER — Ambulatory Visit (INDEPENDENT_AMBULATORY_CARE_PROVIDER_SITE_OTHER): Payer: BC Managed Care – PPO

## 2021-08-15 ENCOUNTER — Encounter: Payer: Self-pay | Admitting: Rheumatology

## 2021-08-15 ENCOUNTER — Ambulatory Visit (INDEPENDENT_AMBULATORY_CARE_PROVIDER_SITE_OTHER): Payer: BC Managed Care – PPO | Admitting: Rheumatology

## 2021-08-15 VITALS — BP 110/70 | HR 47 | Resp 15 | Ht 63.0 in | Wt 220.0 lb

## 2021-08-15 DIAGNOSIS — E559 Vitamin D deficiency, unspecified: Secondary | ICD-10-CM

## 2021-08-15 DIAGNOSIS — R5383 Other fatigue: Secondary | ICD-10-CM

## 2021-08-15 DIAGNOSIS — M25561 Pain in right knee: Secondary | ICD-10-CM

## 2021-08-15 DIAGNOSIS — Z79899 Other long term (current) drug therapy: Secondary | ICD-10-CM

## 2021-08-15 DIAGNOSIS — G8929 Other chronic pain: Secondary | ICD-10-CM

## 2021-08-15 DIAGNOSIS — M79641 Pain in right hand: Secondary | ICD-10-CM | POA: Diagnosis not present

## 2021-08-15 DIAGNOSIS — M79642 Pain in left hand: Secondary | ICD-10-CM | POA: Diagnosis not present

## 2021-08-15 DIAGNOSIS — H209 Unspecified iridocyclitis: Secondary | ICD-10-CM

## 2021-08-15 DIAGNOSIS — M25562 Pain in left knee: Secondary | ICD-10-CM

## 2021-08-15 DIAGNOSIS — M79672 Pain in left foot: Secondary | ICD-10-CM

## 2021-08-15 DIAGNOSIS — M533 Sacrococcygeal disorders, not elsewhere classified: Secondary | ICD-10-CM | POA: Diagnosis not present

## 2021-08-15 DIAGNOSIS — F32A Depression, unspecified: Secondary | ICD-10-CM

## 2021-08-15 DIAGNOSIS — M79671 Pain in right foot: Secondary | ICD-10-CM

## 2021-08-15 DIAGNOSIS — M2550 Pain in unspecified joint: Secondary | ICD-10-CM | POA: Diagnosis not present

## 2021-08-15 DIAGNOSIS — M2141 Flat foot [pes planus] (acquired), right foot: Secondary | ICD-10-CM

## 2021-08-15 DIAGNOSIS — M2142 Flat foot [pes planus] (acquired), left foot: Secondary | ICD-10-CM

## 2021-08-15 DIAGNOSIS — Z9889 Other specified postprocedural states: Secondary | ICD-10-CM

## 2021-08-15 DIAGNOSIS — M359 Systemic involvement of connective tissue, unspecified: Secondary | ICD-10-CM

## 2021-08-15 DIAGNOSIS — F419 Anxiety disorder, unspecified: Secondary | ICD-10-CM

## 2021-08-16 LAB — VITAMIN D 25 HYDROXY (VIT D DEFICIENCY, FRACTURES): Vit D, 25-Hydroxy: 42 ng/mL (ref 30–100)

## 2021-08-23 NOTE — Progress Notes (Deleted)
Office Visit Note  Patient: Abigail Stuart             Date of Birth: 07/21/1980           MRN: [id number]             PCP: Augustine Radar, MD Referring: [facility] Medical Visit Date: 09/05/2021 Occupation: @GUAROCC @  Subjective:  No chief complaint on file.   History of Present Illness: Abigail Stuart is a 41 y.o. female ***   Activities of Daily Living:  Patient reports morning stiffness for all day. Patient Reports nocturnal pain.  Difficulty dressing/grooming: Denies Difficulty climbing stairs: Denies Difficulty getting out of chair: Denies Difficulty using hands for taps, buttons, cutlery, and/or writing: Reports  Review of Systems  Constitutional:  Positive for fatigue.  HENT:  Negative for mouth sores, mouth dryness and nose dryness.   Eyes:  Negative for pain, itching and dryness.  Respiratory:  Negative for shortness of breath and difficulty breathing.   Cardiovascular:  Negative for chest pain and palpitations.  Gastrointestinal:  Negative for blood in stool, constipation and diarrhea.  Endocrine: Negative for increased urination.  Genitourinary:  Negative for difficulty urinating.  Musculoskeletal:  Positive for joint pain, joint pain, joint swelling, myalgias, morning stiffness, muscle tenderness and myalgias.  Skin:  Negative for color change, rash and redness.  Allergic/Immunologic: Negative for susceptible to infections.  Neurological:  Positive for dizziness, numbness and headaches. Negative for memory loss and weakness.  Hematological:  Negative for bruising/bleeding tendency.  Psychiatric/Behavioral:  Negative for confusion.     PMFS History:  Patient Active Problem List   Diagnosis Date Noted   Uveitis 08/15/2021   Acute cholecystitis 11/05/2019    History reviewed. No pertinent past medical history.  History reviewed. No pertinent family history. Past Surgical History:  Procedure Laterality Date   BUNIONECTOMY     CESAREAN  SECTION     CHOLECYSTECTOMY N/A 11/06/2019   Procedure: LAPAROSCOPIC CHOLECYSTECTOMY WITH INTRAOPERATIVE CHOLANGIOGRAM;  Surgeon: 01/06/2020, MD;  Location: [facility];  Service: General;  Laterality: N/A;   Social History   Social History Narrative   Not on file   Immunization History  Administered Date(s) Administered   PFIZER(Purple Top)SARS-COV-2 Vaccination 06/04/2019, 06/24/2019     Objective: Vital Signs: BP 125/81 (BP Location: Left Arm, Patient Position: Sitting, Cuff Size: Normal)   Pulse (!) 56   Ht 5\' 3"  (1.6 m)   Wt 221 lb 12.8 oz (100.6 kg)   BMI 39.29 kg/m    Physical Exam   Musculoskeletal Exam: ***  CDAI Exam: CDAI Score: -- Patient Global: --; Provider Global: -- Swollen: --; Tender: -- Joint Exam 09/05/2021   No joint exam has been documented for this visit   There is currently no information documented on the homunculus. Go to the Rheumatology activity and complete the homunculus joint exam.  Investigation: No additional findings.  Imaging: XR KNEE 3 VIEW RIGHT  Result Date: 08/19/2021 Moderate medial compartment narrowing and mild patellofemoral narrowing was noted.  No chondrocalcinosis was noted. Impression: These findings are consistent with moderate osteoarthritis and mild chondromalacia patella.  XR KNEE 3 VIEW LEFT  Result Date: 08/19/2021 Moderate medial compartment narrowing and mild patellofemoral narrowing was noted.  No chondrocalcinosis was noted. Impression: These findings are consistent with moderate osteoarthritis and mild chondromalacia patella.  XR Pelvis 1-2 Views  Result Date: 08/15/2021 No SI joint sclerosis or narrowing was noted.  No hip joint narrowing was noted. Impression: Unremarkable x-rays  of the SI joints.  XR Foot 2 Views Left  Result Date: 08/15/2021 Postsurgical changes were noted in the first metatarsal with pin placement.  Mild PIP and DIP narrowing was noted.  No intertarsal narrowing was noted.  No erosive  changes were noted. Impression: These findings are consistent with osteoarthritis of the foot.  XR Foot 2 Views Right  Result Date: 08/15/2021 Postsurgical changes were noted in the first metatarsal with pin placement.  Mild PIP and DIP narrowing was noted.  No intertarsal narrowing was noted.  No erosive changes were noted. Impression: These findings are consistent with osteoarthritis of the foot.  XR Hand 2 View Left  Result Date: 08/15/2021 CMC, PIP and DIP narrowing was noted.  No MCP, intercarpal or radiocarpal joint space narrowing was noted.  No erosive changes were noted. Impression: These findings are consistent with early osteoarthritis of the hand.  XR Hand 2 View Right  Result Date: 08/15/2021 CMC, PIP and DIP narrowing was noted.  No MCP, intercarpal or radiocarpal joint space narrowing was noted.  No erosive changes were noted. Impression: These findings are consistent with early osteoarthritis of the hand.   Recent Labs: Lab Results  Component Value Date   WBC 8.9 11/06/2019   HGB 12.1 11/06/2019   PLT 168 11/06/2019   NA 141 11/06/2019   K 4.4 11/06/2019   CL 109 11/06/2019   CO2 22 11/06/2019   GLUCOSE 112 (H) 11/06/2019   BUN 5 (L) 11/06/2019   CREATININE 0.73 11/06/2019   BILITOT 0.3 11/05/2019   ALKPHOS 76 11/05/2019   AST 18 11/05/2019   ALT 19 11/05/2019   PROT 8.2 (H) 11/05/2019   ALBUMIN 4.2 11/05/2019   CALCIUM 8.6 (L) 11/06/2019   GFRAA >60 11/06/2019   04/17/2021 vitamin D42  Speciality Comments: No specialty comments available.  Procedures:  No procedures performed Allergies: Patient has no known allergies.   Assessment / Plan:     Visit Diagnoses: Polyarthralgia  Uveitis  Primary osteoarthritis of both hands  Primary osteoarthritis of both knees  Primary osteoarthritis of both feet  Pes planus of both feet  History of bunionectomy of both great toes  Chronic SI joint pain  Vitamin D deficiency  Anxiety and  depression  Orders: No orders of the defined types were placed in this encounter.  No orders of the defined types were placed in this encounter.   Face-to-face time spent with patient was *** minutes. Greater than 50% of time was spent in counseling and coordination of care.  Follow-Up Instructions: No follow-ups on file.   Pollyann Savoy, MD  Note - This record has been created using Animal nutritionist.  Chart creation errors have been sought, but may not always  have been located. Such creation errors do not reflect on  the standard of medical care.

## 2021-09-05 ENCOUNTER — Encounter: Payer: Self-pay | Admitting: Rheumatology

## 2021-09-05 ENCOUNTER — Encounter: Payer: BC Managed Care – PPO | Admitting: Rheumatology

## 2021-09-05 VITALS — BP 125/81 | HR 56 | Ht 63.0 in | Wt 221.8 lb

## 2021-09-05 DIAGNOSIS — H209 Unspecified iridocyclitis: Secondary | ICD-10-CM | POA: Diagnosis not present

## 2021-09-05 DIAGNOSIS — Z9889 Other specified postprocedural states: Secondary | ICD-10-CM

## 2021-09-05 DIAGNOSIS — E559 Vitamin D deficiency, unspecified: Secondary | ICD-10-CM

## 2021-09-05 DIAGNOSIS — M19071 Primary osteoarthritis, right ankle and foot: Secondary | ICD-10-CM

## 2021-09-05 DIAGNOSIS — M2550 Pain in unspecified joint: Secondary | ICD-10-CM | POA: Diagnosis not present

## 2021-09-05 DIAGNOSIS — M19072 Primary osteoarthritis, left ankle and foot: Secondary | ICD-10-CM

## 2021-09-05 DIAGNOSIS — F32A Depression, unspecified: Secondary | ICD-10-CM

## 2021-09-05 DIAGNOSIS — M2141 Flat foot [pes planus] (acquired), right foot: Secondary | ICD-10-CM

## 2021-09-05 DIAGNOSIS — M170 Bilateral primary osteoarthritis of knee: Secondary | ICD-10-CM

## 2021-09-05 DIAGNOSIS — F419 Anxiety disorder, unspecified: Secondary | ICD-10-CM

## 2021-09-05 DIAGNOSIS — M2142 Flat foot [pes planus] (acquired), left foot: Secondary | ICD-10-CM

## 2021-09-05 DIAGNOSIS — G8929 Other chronic pain: Secondary | ICD-10-CM

## 2021-09-05 DIAGNOSIS — M19042 Primary osteoarthritis, left hand: Secondary | ICD-10-CM

## 2021-09-05 DIAGNOSIS — Z79899 Other long term (current) drug therapy: Secondary | ICD-10-CM | POA: Diagnosis not present

## 2021-09-05 DIAGNOSIS — Z111 Encounter for screening for respiratory tuberculosis: Secondary | ICD-10-CM | POA: Diagnosis not present

## 2021-09-05 NOTE — Progress Notes (Signed)
Visit not completed. Lab results not received to discuss.  This encounter was created in error - please disregard.

## 2021-09-09 LAB — CBC WITH DIFFERENTIAL/PLATELET
Absolute Monocytes: 279 cells/uL (ref 200–950)
Basophils Absolute: 41 cells/uL (ref 0–200)
Basophils Relative: 0.9 %
Eosinophils Absolute: 72 cells/uL (ref 15–500)
Eosinophils Relative: 1.6 %
HCT: 38.1 % (ref 35.0–45.0)
Hemoglobin: 12.2 g/dL (ref 11.7–15.5)
Lymphs Abs: 1814 cells/uL (ref 850–3900)
MCH: 27.3 pg (ref 27.0–33.0)
MCHC: 32 g/dL (ref 32.0–36.0)
MCV: 85.2 fL (ref 80.0–100.0)
MPV: 12.2 fL (ref 7.5–12.5)
Monocytes Relative: 6.2 %
Neutro Abs: 2295 cells/uL (ref 1500–7800)
Neutrophils Relative %: 51 %
Platelets: 234 10*3/uL (ref 140–400)
RBC: 4.47 10*6/uL (ref 3.80–5.10)
RDW: 14.4 % (ref 11.0–15.0)
Total Lymphocyte: 40.3 %
WBC: 4.5 10*3/uL (ref 3.8–10.8)

## 2021-09-09 LAB — PROTEIN ELECTROPHORESIS, SERUM
Albumin ELP: 4.2 g/dL (ref 3.8–4.8)
Alpha 1: 0.2 g/dL (ref 0.2–0.3)
Alpha 2: 0.8 g/dL (ref 0.5–0.9)
Beta 2: 0.5 g/dL (ref 0.2–0.5)
Beta Globulin: 0.5 g/dL (ref 0.4–0.6)
Gamma Globulin: 1.7 g/dL (ref 0.8–1.7)
Total Protein: 7.9 g/dL (ref 6.1–8.1)

## 2021-09-09 LAB — COMPLETE METABOLIC PANEL WITH GFR
AG Ratio: 1.4 (calc) (ref 1.0–2.5)
ALT: 10 U/L (ref 6–29)
AST: 12 U/L (ref 10–30)
Albumin: 4.4 g/dL (ref 3.6–5.1)
Alkaline phosphatase (APISO): 65 U/L (ref 31–125)
BUN: 7 mg/dL (ref 7–25)
CO2: 18 mmol/L — ABNORMAL LOW (ref 20–32)
Calcium: 9.3 mg/dL (ref 8.6–10.2)
Chloride: 105 mmol/L (ref 98–110)
Creat: 0.85 mg/dL (ref 0.50–0.99)
Globulin: 3.2 g/dL (calc) (ref 1.9–3.7)
Glucose, Bld: 83 mg/dL (ref 65–99)
Potassium: 4.4 mmol/L (ref 3.5–5.3)
Sodium: 139 mmol/L (ref 135–146)
Total Bilirubin: 0.5 mg/dL (ref 0.2–1.2)
Total Protein: 7.6 g/dL (ref 6.1–8.1)
eGFR: 89 mL/min/{1.73_m2} (ref >=60)

## 2021-09-09 LAB — QUANTIFERON-TB GOLD PLUS
Mitogen-NIL: >10 IU/mL
NIL: 0.03 IU/mL
QuantiFERON-TB Gold Plus: NEGATIVE
TB1-NIL: 0.13 IU/mL
TB2-NIL: 0.09 IU/mL

## 2021-09-09 LAB — HEPATITIS C ANTIBODY: Hepatitis C Ab: NONREACTIVE

## 2021-09-09 LAB — EPSTEIN-BARR VIRUS VCA ANTIBODY PANEL
EBV NA IgG: >600 U/mL — ABNORMAL HIGH
EBV VCA IgG: 585 U/mL — ABNORMAL HIGH
EBV VCA IgM: <36 U/mL

## 2021-09-09 LAB — C-REACTIVE PROTEIN: CRP: 5.4 mg/L (ref <8.0)

## 2021-09-09 LAB — TOXOPLASMA GONDII ANTIBODY, IGM: Toxoplasma Antibody- IgM: <8 AU/mL

## 2021-09-09 LAB — HSV(HERPES SIMPLEX VRS) I + II AB-IGG
HAV 1 IGG,TYPE SPECIFIC AB: <0.9 index
HSV 2 IGG,TYPE SPECIFIC AB: >23 index — ABNORMAL HIGH

## 2021-09-09 LAB — CYCLIC CITRUL PEPTIDE ANTIBODY, IGG: Cyclic Citrullin Peptide Ab: <16 UNITS

## 2021-09-09 LAB — HLA-B27 ANTIGEN: HLA-B27 Antigen: NEGATIVE

## 2021-09-09 LAB — HEPATITIS B CORE ANTIBODY, IGM: Hep B C IgM: NONREACTIVE

## 2021-09-09 LAB — CMV ABS, IGG+IGM (CYTOMEGALOVIRUS)
CMV IgM: <30 AU/mL
Cytomegalovirus Ab-IgG: >10 U/mL — ABNORMAL HIGH

## 2021-09-09 NOTE — Progress Notes (Addendum)
Office Visit Note  Patient: Abigail Stuart             Date of Birth: 1980-03-04           MRN: [id number]             PCP: Wendie Simmer, MD Referring: Wendie Simmer, MD Visit Date: 09/23/2021 Occupation: @GUAROCC @  Subjective:  Pain in multiple joints  History of Present Illness: Abigail Stuart is a 41 y.o. female with history of polyarthralgia and uveitis.  She states she has been having pain and discomfort in multiple joints involving her shoulders, hands, knees.  She has not seen any joint swelling.  She states that her shoulders and knee joints pop all the time.  She has not had any recurrence of uveitis.  Activities of Daily Living:  Patient reports morning stiffness for several hours.   Patient Reports nocturnal pain.  Difficulty dressing/grooming: Denies Difficulty climbing stairs: Denies Difficulty getting out of chair: Denies Difficulty using hands for taps, buttons, cutlery, and/or writing: Reports  Review of Systems  Constitutional:  Positive for fatigue.  HENT:  Negative for mouth sores and mouth dryness.   Eyes:  Positive for photophobia and dryness.  Respiratory:  Negative for shortness of breath.   Cardiovascular:  Negative for chest pain and palpitations.  Gastrointestinal:  Negative for blood in stool, constipation and diarrhea.  Endocrine: Negative for increased urination.  Genitourinary:  Negative for involuntary urination.  Musculoskeletal:  Positive for joint swelling, myalgias, morning stiffness, muscle tenderness and myalgias. Negative for joint pain, joint pain and muscle weakness.  Skin:  Negative for color change, rash, hair loss and sensitivity to sunlight.  Allergic/Immunologic: Positive for susceptible to infections.  Neurological:  Positive for headaches. Negative for dizziness.  Hematological:  Positive for bruising/bleeding tendency. Negative for swollen glands.  Psychiatric/Behavioral:  Positive for depressed mood and sleep  disturbance. The patient is nervous/anxious.     PMFS History:  Patient Active Problem List   Diagnosis Date Noted   Uveitis 08/15/2021   Acute cholecystitis 11/05/2019    History reviewed. No pertinent past medical history.  History reviewed. No pertinent family history. Past Surgical History:  Procedure Laterality Date   BUNIONECTOMY     CESAREAN SECTION     CHOLECYSTECTOMY N/A 11/06/2019   Procedure: LAPAROSCOPIC CHOLECYSTECTOMY WITH INTRAOPERATIVE CHOLANGIOGRAM;  Surgeon: Jesusita Oka, MD;  Location: [facility];  Service: General;  Laterality: N/A;   Social History   Social History Narrative   Not on file   Immunization History  Administered Date(s) Administered   PFIZER(Purple Top)SARS-COV-2 Vaccination 06/04/2019, 06/24/2019     Objective: Vital Signs: BP 117/74 (BP Location: Left Arm, Patient Position: Sitting, Cuff Size: Normal)   Pulse (!) 53   Ht 5\' 3"  (1.6 m)   Wt 224 lb 6.4 oz (101.8 kg)   BMI 39.75 kg/m    Physical Exam Vitals and nursing note reviewed.  Constitutional:      Appearance: She is well-developed.  HENT:     Head: Normocephalic and atraumatic.  Eyes:     Conjunctiva/sclera: Conjunctivae normal.  Cardiovascular:     Rate and Rhythm: Normal rate and regular rhythm.     Heart sounds: Normal heart sounds.  Pulmonary:     Effort: Pulmonary effort is normal.     Breath sounds: Normal breath sounds.  Abdominal:     General: Bowel sounds are normal.     Palpations: Abdomen is soft.  Musculoskeletal:  Cervical back: Normal range of motion.  Lymphadenopathy:     Cervical: No cervical adenopathy.  Skin:    General: Skin is warm and dry.     Capillary Refill: Capillary refill takes less than 2 seconds.  Neurological:     Mental Status: She is alert and oriented to person, place, and time.  Psychiatric:        Behavior: Behavior normal.      Musculoskeletal Exam: C-spine was in good range of motion.  Shoulder joints, elbow joints, wrist  joints with good range of motion.  She had mild tenderness over SI joints.  Shoulder joints in good range of motion with some discomfort.  No effusion was noted.  Elbow joints, wrist joints, MCPs PIPs and DIPs with good range of motion without any synovitis.  Hip joints and knee joints in good range of motion with hypermobility.  There was no tenderness over ankles or MTP joints.  CDAI Exam: CDAI Score: -- Patient Global: --; Provider Global: -- Swollen: --; Tender: -- Joint Exam 09/23/2021   No joint exam has been documented for this visit   There is currently no information documented on the homunculus. Go to the Rheumatology activity and complete the homunculus joint exam.  Investigation: No additional findings.  Imaging: No results found.  Recent Labs: Lab Results  Component Value Date   WBC 4.5 09/05/2021   HGB 12.2 09/05/2021   PLT 234 09/05/2021   NA 139 09/05/2021   K 4.4 09/05/2021   CL 105 09/05/2021   CO2 18 (L) 09/05/2021   GLUCOSE 83 09/05/2021   BUN 7 09/05/2021   CREATININE 0.85 09/05/2021   BILITOT 0.5 09/05/2021   ALKPHOS 76 11/05/2019   AST 12 09/05/2021   ALT 10 09/05/2021   PROT 7.6 09/05/2021   PROT 7.9 09/05/2021   ALBUMIN 4.2 11/05/2019   CALCIUM 9.3 09/05/2021   GFRAA >60 11/06/2019   QFTBGOLDPLUS NEGATIVE 09/05/2021     Speciality Comments: No specialty comments available.  Procedures:  No procedures performed Allergies: Patient has no known allergies.   Assessment / Plan:     Visit Diagnoses: Polyarthralgia -she complains of of pain and stiffness in both hands for the last 20 years.  She continues to have pain and discomfort in right shoulder, right knee and both feet.  She had no joint tenderness on examination.  No synovitis was noted.  All autoimmune work-up was negative.  Uveitis - Diagnosed by Dr. Bing Plume in February 2023.  All autoimmune work-up negative.September 05, 2021 SPEP normal, TB Gold negative, EBV IgG positive, HSV IgG positive,  CMV IgG positive, CRP normal, toxo negative, HLA-B27 negative, hepatitis B-, hepatitis C negative, anti-CCP negative.  I had a detailed discussion with the patient regarding all the labs.  I advised her to contact me if Dr. Bing Plume decides to put her on immunosuppressive therapy.  Chronic pain of both shoulders -she complains of discomfort in her bilateral shoulders.  Shoulder joints with good range of motion with some popping.  No effusion was noted.  Plan: XR Shoulder Right, XR Shoulder Left.  Bilateral x-rays are within normal limits.  X-ray findings were discussed with the patient.  I will refer her to physical therapy.  Primary osteoarthritis of both hands - History of pain in bilateral hands.  X-rays showed early osteoarthritic changes.  X-ray findings were discussed with the patient.  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  Primary osteoarthritis of both  knees - Bilateral moderate osteoarthritis and mild chondromalacia patella noted.  I will refer her to physical therapy.  Primary osteoarthritis of both feet - Bilateral first MTP showed postsurgical changes on the x-rays.  X-ray findings were discussed with the patient.  Pes planus of both feet-use of arch support and orthotics was discussed.  Chronic SI joint pain - History of chronic back pain and SI joint pain.  X-rays were unremarkable.  X-ray findings were discussed with the patient.  HLA-B27 negative.  Other fatigue  Vitamin D deficiency-vitamin D was within normal limits.  Anxiety and depression  Orders: Orders Placed This Encounter  Procedures   XR Shoulder Right   XR Shoulder Left   Ambulatory referral to Physical Therapy   No orders of the defined types were placed in this encounter.    Follow-Up Instructions: Return in about 1 year (around 09/24/2022) for Osteoarthritis, Uveitis.   Bo Merino, MD  Note - This record has been created using  Editor, commissioning.  Chart creation errors have been sought, but may not always  have been located. Such creation errors do not reflect on  the standard of medical care.

## 2021-09-23 ENCOUNTER — Ambulatory Visit (INDEPENDENT_AMBULATORY_CARE_PROVIDER_SITE_OTHER): Payer: BC Managed Care – PPO | Admitting: Rheumatology

## 2021-09-23 ENCOUNTER — Encounter: Payer: Self-pay | Admitting: Rheumatology

## 2021-09-23 ENCOUNTER — Ambulatory Visit (INDEPENDENT_AMBULATORY_CARE_PROVIDER_SITE_OTHER): Payer: BC Managed Care – PPO

## 2021-09-23 VITALS — BP 117/74 | HR 53 | Ht 63.0 in | Wt 224.4 lb

## 2021-09-23 DIAGNOSIS — M25511 Pain in right shoulder: Secondary | ICD-10-CM | POA: Diagnosis not present

## 2021-09-23 DIAGNOSIS — M2141 Flat foot [pes planus] (acquired), right foot: Secondary | ICD-10-CM

## 2021-09-23 DIAGNOSIS — F32A Depression, unspecified: Secondary | ICD-10-CM

## 2021-09-23 DIAGNOSIS — M170 Bilateral primary osteoarthritis of knee: Secondary | ICD-10-CM

## 2021-09-23 DIAGNOSIS — M19072 Primary osteoarthritis, left ankle and foot: Secondary | ICD-10-CM

## 2021-09-23 DIAGNOSIS — G8929 Other chronic pain: Secondary | ICD-10-CM | POA: Diagnosis not present

## 2021-09-23 DIAGNOSIS — M533 Sacrococcygeal disorders, not elsewhere classified: Secondary | ICD-10-CM

## 2021-09-23 DIAGNOSIS — M25512 Pain in left shoulder: Secondary | ICD-10-CM

## 2021-09-23 DIAGNOSIS — M19071 Primary osteoarthritis, right ankle and foot: Secondary | ICD-10-CM

## 2021-09-23 DIAGNOSIS — E559 Vitamin D deficiency, unspecified: Secondary | ICD-10-CM

## 2021-09-23 DIAGNOSIS — M249 Joint derangement, unspecified: Secondary | ICD-10-CM

## 2021-09-23 DIAGNOSIS — M19042 Primary osteoarthritis, left hand: Secondary | ICD-10-CM

## 2021-09-23 DIAGNOSIS — R5383 Other fatigue: Secondary | ICD-10-CM

## 2021-09-23 DIAGNOSIS — F419 Anxiety disorder, unspecified: Secondary | ICD-10-CM

## 2021-09-23 DIAGNOSIS — M19041 Primary osteoarthritis, right hand: Secondary | ICD-10-CM

## 2021-09-23 DIAGNOSIS — H209 Unspecified iridocyclitis: Secondary | ICD-10-CM | POA: Diagnosis not present

## 2021-09-23 DIAGNOSIS — M2550 Pain in unspecified joint: Secondary | ICD-10-CM

## 2021-09-23 DIAGNOSIS — M2142 Flat foot [pes planus] (acquired), left foot: Secondary | ICD-10-CM

## 2021-09-23 NOTE — Patient Instructions (Signed)
Knee Exercises Ask your health care provider which exercises are safe for you. Do exercises exactly as told by your health care provider and adjust them as directed. It is normal to feel mild stretching, pulling, tightness, or discomfort as you do these exercises. Stop right away if you feel sudden pain or your pain gets worse. Do not begin these exercises until told by your health care provider. Stretching and range-of-motion exercises These exercises warm up your muscles and joints and improve the movement and flexibility of your knee. These exercises also help to relieve pain and swelling. Knee extension, prone  Lie on your abdomen (prone position) on a bed. Place your left / right knee just beyond the edge of the surface so your knee is not on the bed. You can put a towel under your left / right thigh just above your kneecap for comfort. Relax your leg muscles and allow gravity to straighten your knee (extension). You should feel a stretch behind your left / right knee. Hold this position for __________ seconds. Scoot up so your knee is supported between repetitions. Repeat __________ times. Complete this exercise __________ times a day. Knee flexion, active  Lie on your back with both legs straight. If this causes back discomfort, bend your left / right knee so your foot is flat on the floor. Slowly slide your left / right heel back toward your buttocks. Stop when you feel a gentle stretch in the front of your knee or thigh (flexion). Hold this position for __________ seconds. Slowly slide your left / right heel back to the starting position. Repeat __________ times. Complete this exercise __________ times a day. Quadriceps stretch, prone  Lie on your abdomen on a firm surface, such as a bed or padded floor. Bend your left / right knee and hold your ankle. If you cannot reach your ankle or pant leg, loop a belt around your foot and grab the belt instead. Gently pull your heel toward your  buttocks. Your knee should not slide out to the side. You should feel a stretch in the front of your thigh and knee (quadriceps). Hold this position for __________ seconds. Repeat __________ times. Complete this exercise __________ times a day. Hamstring, supine  Lie on your back (supine position). Loop a belt or towel over the ball of your left / right foot. The ball of your foot is on the walking surface, right under your toes. Straighten your left / right knee and slowly pull on the belt to raise your leg until you feel a gentle stretch behind your knee (hamstring). Do not let your knee bend while you do this. Keep your other leg flat on the floor. Hold this position for __________ seconds. Repeat __________ times. Complete this exercise __________ times a day. Strengthening exercises These exercises build strength and endurance in your knee. Endurance is the ability to use your muscles for a long time, even after they get tired. Quadriceps, isometric This exercise strengthens the muscles in front of your thigh (quadriceps) without moving your knee joint (isometric). Lie on your back with your left / right leg extended and your other knee bent. Put a rolled towel or small pillow under your knee if told by your health care provider. Slowly tense the muscles in the front of your left / right thigh. You should see your kneecap slide up toward your hip or see increased dimpling just above the knee. This motion will push the back of the knee toward the floor.   For __________ seconds, hold the muscle as tight as you can without increasing your pain. Relax the muscles slowly and completely. Repeat __________ times. Complete this exercise __________ times a day. Straight leg raises This exercise strengthens the muscles in front of your thigh (quadriceps) and the muscles that move your hips (hip flexors). Lie on your back with your left / right leg extended and your other knee bent. Tense the  muscles in the front of your left / right thigh. You should see your kneecap slide up or see increased dimpling just above the knee. Your thigh may even shake a bit. Keep these muscles tight as you raise your leg 4-6 inches (10-15 cm) off the floor. Do not let your knee bend. Hold this position for __________ seconds. Keep these muscles tense as you lower your leg. Relax your muscles slowly and completely after each repetition. Repeat __________ times. Complete this exercise __________ times a day. Hamstring, isometric  Lie on your back on a firm surface. Bend your left / right knee about __________ degrees. Dig your left / right heel into the surface as if you are trying to pull it toward your buttocks. Tighten the muscles in the back of your thighs (hamstring) to "dig" as hard as you can without increasing any pain. Hold this position for __________ seconds. Release the tension gradually and allow your muscles to relax completely for __________ seconds after each repetition. Repeat __________ times. Complete this exercise __________ times a day. Hamstring curls If told by your health care provider, do this exercise while wearing ankle weights. Begin with __________lb / kg weights. Then increase the weight by 1 lb (0.5 kg) increments. Do not wear ankle weights that are more than __________lb / kg. Lie on your abdomen with your legs straight. Bend your left / right knee as far as you can without feeling pain. Keep your hips flat against the floor. Hold this position for __________ seconds. Slowly lower your leg to the starting position. Repeat __________ times. Complete this exercise __________ times a day. Squats This exercise strengthens the muscles in front of your thigh and knee (quadriceps). Stand in front of a table, with your feet and knees pointing straight ahead. You may rest your hands on the table for balance but not for support. Slowly bend your knees and lower your hips like you  are going to sit in a chair. Keep your weight over your heels, not over your toes. Keep your lower legs upright so they are parallel with the table legs. Do not let your hips go lower than your knees. Do not bend lower than told by your health care provider. If your knee pain increases, do not bend as low. Hold the squat position for __________ seconds. Slowly push with your legs to return to standing. Do not use your hands to pull yourself to standing. Repeat __________ times. Complete this exercise __________ times a day. Wall slides This exercise strengthens the muscles in front of your thigh and knee (quadriceps). Lean your back against a smooth wall or door, and walk your feet out 18-24 inches (46-61 cm) from it. Place your feet hip-width apart. Slowly slide down the wall or door until your knees bend __________ degrees. Keep your knees over your heels, not over your toes. Keep your knees in line with your hips. Hold this position for __________ seconds. Repeat __________ times. Complete this exercise __________ times a day. Straight leg raises, side-lying This exercise strengthens the muscles that rotate   the leg at the hip and move it away from your body (hip abductors). Lie on your side with your left / right leg in the top position. Lie so your head, shoulder, knee, and hip line up. You may bend your bottom knee to help you keep your balance. Roll your hips slightly forward so your hips are stacked directly over each other and your left / right knee is facing forward. Leading with your heel, lift your top leg 4-6 inches (10-15 cm). You should feel the muscles in your outer hip lifting. Do not let your foot drift forward. Do not let your knee roll toward the ceiling. Hold this position for __________ seconds. Slowly return your leg to the starting position. Let your muscles relax completely after each repetition. Repeat __________ times. Complete this exercise __________ times a  day. Straight leg raises, prone This exercise stretches the muscles that move your hips away from the front of the pelvis (hip extensors). Lie on your abdomen on a firm surface. You can put a pillow under your hips if that is more comfortable. Tense the muscles in your buttocks and lift your left / right leg about 4-6 inches (10-15 cm). Keep your knee straight as you lift your leg. Hold this position for __________ seconds. Slowly lower your leg to the starting position. Let your leg relax completely after each repetition. Repeat __________ times. Complete this exercise __________ times a day. This information is not intended to replace advice given to you by your health care provider. Make sure you discuss any questions you have with your health care provider. Document Revised: 10/29/2020 Document Reviewed: 10/29/2020 [employer] Patient Education  2023 [employer] Shoulder Exercises Ask your health care provider which exercises are safe for you. Do exercises exactly as told by your health care provider and adjust them as directed. It is normal to feel mild stretching, pulling, tightness, or discomfort as you do these exercises. Stop right away if you feel sudden pain or your pain gets worse. Do not begin these exercises until told by your health care provider. Stretching exercises External rotation and abduction This exercise is sometimes called corner stretch. This exercise rotates your arm outward (external rotation) and moves your arm out from your body (abduction). Stand in a doorway with one of your feet slightly in front of the other. This is called a staggered stance. If you cannot reach your forearms to the door frame, stand facing a corner of a room. Choose one of the following positions as told by your health care provider: Place your hands and forearms on the door frame above your head. Place your hands and forearms on the door frame at the height of your head. Place your hands on  the door frame at the height of your elbows. Slowly move your weight onto your front foot until you feel a stretch across your chest and in the front of your shoulders. Keep your head and chest upright and keep your abdominal muscles tight. Hold for __________ seconds. To release the stretch, shift your weight to your back foot. Repeat __________ times. Complete this exercise __________ times a day. Extension, standing Stand and hold a broomstick, a cane, or a similar object behind your back. Your hands should be a little wider than shoulder width apart. Your palms should face away from your back. Keeping your elbows straight and your shoulder muscles relaxed, move the stick away from your body until you feel a stretch in your shoulders (extension). Avoid  shrugging your shoulders while you move the stick. Keep your shoulder blades tucked down toward the middle of your back. Hold for __________ seconds. Slowly return to the starting position. Repeat __________ times. Complete this exercise __________ times a day. Range-of-motion exercises Pendulum  Stand near a wall or a surface that you can hold onto for balance. Bend at the waist and let your left / right arm hang straight down. Use your other arm to support you. Keep your back straight and do not lock your knees. Relax your left / right arm and shoulder muscles, and move your hips and your trunk so your left / right arm swings freely. Your arm should swing because of the motion of your body, not because you are using your arm or shoulder muscles. Keep moving your hips and trunk so your arm swings in the following directions, as told by your health care provider: Side to side. Forward and backward. In clockwise and counterclockwise circles. Continue each motion for __________ seconds, or for as long as told by your health care provider. Slowly return to the starting position. Repeat __________ times. Complete this exercise __________ times  a day. Shoulder flexion, standing  Stand and hold a broomstick, a cane, or a similar object. Place your hands a little more than shoulder width apart on the object. Your left / right hand should be palm up, and your other hand should be palm down. Keep your elbow straight and your shoulder muscles relaxed. Push the stick up with your healthy arm to raise your left / right arm in front of your body, and then over your head until you feel a stretch in your shoulder (flexion). Avoid shrugging your shoulder while you raise your arm. Keep your shoulder blade tucked down toward the middle of your back. Hold for __________ seconds. Slowly return to the starting position. Repeat __________ times. Complete this exercise __________ times a day. Shoulder abduction, standing Stand and hold a broomstick, a cane, or a similar object. Place your hands a little more than shoulder width apart on the object. Your left / right hand should be palm up, and your other hand should be palm down. Keep your elbow straight and your shoulder muscles relaxed. Push the object across your body toward your left / right side. Raise your left / right arm to the side of your body (abduction) until you feel a stretch in your shoulder. Do not raise your arm above shoulder height unless your health care provider tells you to do that. If directed, raise your arm over your head. Avoid shrugging your shoulder while you raise your arm. Keep your shoulder blade tucked down toward the middle of your back. Hold for __________ seconds. Slowly return to the starting position. Repeat __________ times. Complete this exercise __________ times a day. Internal rotation  Place your left / right hand behind your back, palm up. Use your other hand to dangle an exercise band, a towel, or a similar object over your shoulder. Grasp the band with your left / right hand so you are holding on to both ends. Gently pull up on the band until you feel a  stretch in the front of your left / right shoulder. The movement of your arm toward the center of your body is called internal rotation. Avoid shrugging your shoulder while you raise your arm. Keep your shoulder blade tucked down toward the middle of your back. Hold for __________ seconds. Release the stretch by letting go of the  band and lowering your hands. Repeat __________ times. Complete this exercise __________ times a day. Strengthening exercises External rotation  Sit in a stable chair without armrests. Secure an exercise band to a stable object at elbow height on your left / right side. Place a soft object, such as a folded towel or a small pillow, between your left / right upper arm and your body to move your elbow about 4 inches (10 cm) away from your side. Hold the end of the exercise band so it is tight and there is no slack. Keeping your elbow pressed against the soft object, slowly move your forearm out, away from your abdomen (external rotation). Keep your body steady so only your forearm moves. Hold for __________ seconds. Slowly return to the starting position. Repeat __________ times. Complete this exercise __________ times a day. Shoulder abduction  Sit in a stable chair without armrests, or stand up. Hold a __________ weight in your left / right hand, or hold an exercise band with both hands. Start with your arms straight down and your left / right palm facing in, toward your body. Slowly lift your left / right hand out to your side (abduction). Do not lift your hand above shoulder height unless your health care provider tells you that this is safe. Keep your arms straight. Avoid shrugging your shoulder while you do this movement. Keep your shoulder blade tucked down toward the middle of your back. Hold for __________ seconds. Slowly lower your arm, and return to the starting position. Repeat __________ times. Complete this exercise __________ times a day. Shoulder  extension Sit in a stable chair without armrests, or stand up. Secure an exercise band to a stable object in front of you so it is at shoulder height. Hold one end of the exercise band in each hand. Your palms should face each other. Straighten your elbows and lift your hands up to shoulder height. Step back, away from the secured end of the exercise band, until the band is tight and there is no slack. Squeeze your shoulder blades together as you pull your hands down to the sides of your thighs (extension). Stop when your hands are straight down by your sides. Do not let your hands go behind your body. Hold for __________ seconds. Slowly return to the starting position. Repeat __________ times. Complete this exercise __________ times a day. Shoulder row Sit in a stable chair without armrests, or stand up. Secure an exercise band to a stable object in front of you so it is at waist height. Hold one end of the exercise band in each hand. Position your palms so that your thumbs are facing the ceiling (neutral position). Bend each of your elbows to a 90-degree angle (right angle) and keep your upper arms at your sides. Step back until the band is tight and there is no slack. Slowly pull your elbows back behind you. Hold for __________ seconds. Slowly return to the starting position. Repeat __________ times. Complete this exercise __________ times a day. Shoulder press-ups  Sit in a stable chair that has armrests. Sit upright, with your feet flat on the floor. Put your hands on the armrests so your elbows are bent and your fingers are pointing forward. Your hands should be about even with the sides of your body. Push down on the armrests and use your arms to lift yourself off the chair. Straighten your elbows and lift yourself up as much as you comfortably can. Move your shoulder blades  down, and avoid letting your shoulders move up toward your ears. Keep your feet on the ground. As you get  stronger, your feet should support less of your body weight as you lift yourself up. Hold for __________ seconds. Slowly lower yourself back into the chair. Repeat __________ times. Complete this exercise __________ times a day. Wall push-ups  Stand so you are facing a stable wall. Your feet should be about one arm-length away from the wall. Lean forward and place your palms on the wall at shoulder height. Keep your feet flat on the floor as you bend your elbows and lean forward toward the wall. Hold for __________ seconds. Straighten your elbows to push yourself back to the starting position. Repeat __________ times. Complete this exercise __________ times a day. This information is not intended to replace advice given to you by your health care provider. Make sure you discuss any questions you have with your health care provider. Document Revised: 02/11/2021 Document Reviewed: 03/18/2018 [employer] Patient Education  2023 [employer].

## 2021-12-09 DIAGNOSIS — O09611 Supervision of young primigravida, first trimester: Secondary | ICD-10-CM | POA: Insufficient documentation

## 2021-12-10 ENCOUNTER — Encounter (HOSPITAL_BASED_OUTPATIENT_CLINIC_OR_DEPARTMENT_OTHER): Payer: Self-pay | Admitting: *Deleted

## 2021-12-10 ENCOUNTER — Emergency Department (HOSPITAL_BASED_OUTPATIENT_CLINIC_OR_DEPARTMENT_OTHER): Payer: BC Managed Care – PPO

## 2021-12-10 ENCOUNTER — Emergency Department (HOSPITAL_BASED_OUTPATIENT_CLINIC_OR_DEPARTMENT_OTHER)
Admission: EM | Admit: 2021-12-10 | Discharge: 2021-12-10 | Disposition: A | Discharge status: Home / Self Care | Payer: BC Managed Care – PPO | Attending: Emergency Medicine | Admitting: Emergency Medicine

## 2021-12-10 DIAGNOSIS — O26891 Other specified pregnancy related conditions, first trimester: Secondary | ICD-10-CM | POA: Diagnosis not present

## 2021-12-10 DIAGNOSIS — Z3401 Encounter for supervision of normal first pregnancy, first trimester: Secondary | ICD-10-CM

## 2021-12-10 DIAGNOSIS — Z3A01 Less than 8 weeks gestation of pregnancy: Secondary | ICD-10-CM | POA: Diagnosis not present

## 2021-12-10 DIAGNOSIS — R102 Pelvic and perineal pain: Secondary | ICD-10-CM | POA: Diagnosis not present

## 2021-12-10 LAB — WET PREP, GENITAL
Sperm: NONE SEEN
Trich, Wet Prep: NONE SEEN
WBC, Wet Prep HPF POC: <10 (ref <10)
Yeast Wet Prep HPF POC: NONE SEEN

## 2021-12-10 LAB — URINALYSIS, ROUTINE W REFLEX MICROSCOPIC
Bilirubin Urine: NEGATIVE
Glucose, UA: NEGATIVE mg/dL
Hgb urine dipstick: NEGATIVE
Ketones, ur: NEGATIVE mg/dL
Leukocytes,Ua: NEGATIVE
Nitrite: NEGATIVE
Protein, ur: NEGATIVE mg/dL
Specific Gravity, Urine: 1.014 (ref 1.005–1.030)
pH: 5.5 (ref 5.0–8.0)

## 2021-12-10 LAB — HCG, QUANTITATIVE, PREGNANCY: hCG, Beta Chain, Quant, S: 6201 m[IU]/mL — ABNORMAL HIGH (ref <5)

## 2021-12-10 LAB — PREGNANCY, URINE: Preg Test, Ur: POSITIVE — AB

## 2021-12-10 NOTE — ED Triage Notes (Addendum)
Pt is here for further evaluation of pregnancy. Pt reports LMP was 8/25 and she had a positive urine pregnancy test at "[facility]" as well as a transvaginal ultrasound.  Pt reports that pregnancy was not visualized on ultrasound and she would like further evaluation.  No bleeding. Pt has been having some mild cramping for several weeks, usually on right side

## 2021-12-10 NOTE — ED Provider Notes (Signed)
[facility] EMERGENCY DEPT Provider Note  CSN: [id number] Arrival date & time: 12/09/21 2353  Chief Complaint(s) Possible Pregnancy ED Triage Notes Flueckiger, Armen Pickup, RN (Registered Nurse)   Date of Service: 12/10/2021 12:06 AM   Addendum   Pt is here for further evaluation of pregnancy. Pt reports LMP was 8/25 and she had a positive urine pregnancy test at [facility]" as well as a transvaginal ultrasound.  Pt reports that pregnancy was not visualized on ultrasound and she would like further evaluation.  No bleeding. Pt has been having some mild cramping for several weeks, usually on right side     HPI Abigail Stuart is a 40 y.o. female who presents for lower abdominal cramping which she has had periodically over the years.  She noted a positive pregnancy test at home and [facility] who confirmed the pregnancy.  They asked her to return today to get an ultrasound for confirmation and IUP.  She reports that they were unable to visualize the pregnancy within the uterus prompting her to come here.  Patient denies any vaginal bleeding or discharge.  Denies any other physical complaints.  The history is provided by the patient.    Past Medical History History reviewed. No pertinent past medical history. 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 Authorizing Provider  albuterol (VENTOLIN HFA) 108 (90 Base) MCG/ACT inhaler Inhale 2 puffs into the lungs every 6 (six) hours as needed for wheezing or shortness of breath. Patient not taking: Reported on 09/23/2021    [provider]  cetirizine (ZYRTEC) 10 MG tablet Take 10 mg by mouth daily as needed for allergies.     [provider]  diphenhydrAMINE (BENADRYL) 25 MG tablet Take 25 mg by mouth at bedtime as  needed for allergies. Patient not taking: Reported on 09/23/2021    [provider]  ferrous sulfate 325 (65 FE) MG tablet Take 325 mg by mouth as needed.    [provider]  gabapentin (NEURONTIN) 300 MG capsule Take 300 mg by mouth at bedtime as needed (nerve pain).    [provider]  ibuprofen (ADVIL) 800 MG tablet Take 800 mg by mouth daily as needed for headache or cramping.    [provider]  montelukast (SINGULAIR) 10 MG tablet Take 10 mg by mouth at bedtime as needed (allergies).  05/29/15   [provider]  Multiple Vitamin (MULTIVITAMIN WITH MINERALS) TABS tablet Take 1-2 tablets by mouth See admin instructions. Take 2 tablets by mouth every morning and 1 tablet at night    [provider]  Multiple Vitamins-Minerals (HAIR/SKIN/NAILS/BIOTIN PO) Take 1-2 tablets by mouth See admin instructions. Take 2 tablets by mouth every morning and 1 tablet at night    [provider]  Allergies Patient has no known allergies.  Review of Systems Review of Systems As noted in HPI  Physical Exam Vital Signs  I have reviewed the triage vital signs BP 121/72 (BP Location: Right Arm)   Pulse (!) 56   Temp 98 F (36.7 C) (Oral)   Resp 18   LMP 10/24/2021 (Exact Date)   SpO2 100%   Physical Exam Vitals reviewed. Exam conducted with a chaperone present.  Constitutional:      General: She is not in acute distress.    Appearance: She is well-developed. She is not diaphoretic.  HENT:     Head: Normocephalic and atraumatic.     Right Ear: External ear normal.     Left Ear: External ear normal.     Nose: Nose normal.  Eyes:     General: No scleral icterus.    Conjunctiva/sclera: Conjunctivae normal.  Neck:     Trachea: Phonation normal.  Cardiovascular:     Rate and Rhythm: Normal rate and regular  rhythm.  Pulmonary:     Effort: Pulmonary effort is normal. No respiratory distress.     Breath sounds: No stridor.  Abdominal:     General: There is no distension.     Tenderness: There is no abdominal tenderness. There is no guarding or rebound.  Genitourinary:    Vagina: No vaginal discharge or bleeding.     Cervix: No discharge, friability, lesion, erythema or cervical bleeding.  Musculoskeletal:        General: Normal range of motion.     Cervical back: Normal range of motion.  Neurological:     Mental Status: She is alert and oriented to person, place, and time.  Psychiatric:        Behavior: Behavior normal.     ED Results and Treatments Labs (all labs ordered are listed, but only abnormal results are displayed) Labs Reviewed  WET PREP, GENITAL - Abnormal; Notable for the following components:      Result Value   Clue Cells Wet Prep HPF POC PRESENT (*)    All other components within normal limits  HCG, QUANTITATIVE, PREGNANCY - Abnormal; Notable for the following components:   hCG, Beta Chain, Quant, S 6,201 (*)    All other components within normal limits  PREGNANCY, URINE - Abnormal; Notable for the following components:   Preg Test, Ur POSITIVE (*)    All other components within normal limits  URINALYSIS, ROUTINE W REFLEX MICROSCOPIC  GC/CHLAMYDIA PROBE AMP ([place]) NOT AT [facility]                                                                                                                         EKG  EKG Interpretation  Date/Time:    Ventricular Rate:    PR Interval:    QRS Duration:   QT Interval:    QTC Calculation:   R Axis:     Text Interpretation:         Radiology  US OB LESS THAN 14 WEEKS WITH OB TRANSVAGINAL  Result Date: 12/10/2021 CLINICAL DATA:  Pelvic cramping. Reportedly had ultrasound today at outside facility and did not located gestational sac. Estimated gestational age by LMP is 6 weeks 5 days. Quantitative beta HCG is 6,201.  EXAM: OBSTETRIC <14 WK [place] AND TRANSVAGINAL OB US TECHNIQUE: Both transabdominal and transvaginal ultrasound examinations were performed for complete evaluation of the gestation as well as the maternal uterus, adnexal regions, and pelvic cul-de-sac. Transvaginal technique was performed to assess early pregnancy. COMPARISON:  None Available. FINDINGS: Intrauterine gestational sac: A single intrauterine gestational sac is identified. Yolk sac:  Not Visualized. Embryo:  Not Visualized. Cardiac Activity: Not Visualized. MSD: 8.6 mm   5 w   5 d Subchorionic hemorrhage:  None visualized. Maternal uterus/adnexae: Uterus is anteverted. No myometrial mass lesions. Small nabothian cysts in the cervix. Both ovaries are visualized with normal appearance. Corpus luteal cyst on the right. No free fluid. IMPRESSION: Probable early intrauterine gestational sac, but no yolk sac, fetal pole, or cardiac activity yet visualized. Recommend follow-up quantitative B-HCG levels and follow-up US in 14 days to assess viability. This recommendation follows SRU consensus guidelines: Diagnostic Criteria for Nonviable Pregnancy Early in the First Trimester. Malva Limes Med 2013; 097:3532-99. Estimated gestational age by mean sac diameter is 5 weeks 5 days. Electronically Signed   By: Burman Nieves M.D.   On: 12/10/2021 03:09    Medications Ordered in ED Medications - No data to display                                                                                                                                   Procedures Procedures  (including critical care time)  Medical Decision Making / ED Course   Medical Decision Making Amount and/or Complexity of Data Reviewed Labs: ordered. Decision-making details documented in ED Course. Radiology: ordered and independent interpretation performed. Decision-making details documented in ED Course.   Abdominal cramping with reported positive pregnancy test. Abdomen benign Pelvic exam  without evidence of cervicitis or PID. We will confirm pregnancy and obtain an ultrasound.  UPT positive. hCG quant slightly over 6000. Ultrasound notable for gestational sac without fetal pole or yolk sac. Recommended close follow-up with OB/GYN for repeat beta hCG quant and ultrasound     Final Clinical Impression(s) / ED Diagnoses Final diagnoses:  Primigravida in first trimester   The patient appears reasonably screened and/or stabilized for discharge and I doubt any other medical condition or other [facility] requiring further screening, evaluation, or treatment in the ED at this time. I have discussed the findings, Dx and Tx plan with the patient/family who expressed understanding and agree(s) with the plan. Discharge instructions discussed at length. The patient/family was given strict return precautions who verbalized understanding of the instructions. No further questions at time of discharge.  Disposition: Discharge  Condition: Good  ED Discharge Orders  Ordered    Ambulatory referral to Obstetrics / Gynecology        12/10/21 0350             Follow Up: Center for [facility] at [facility] [address] [place] [address] [phone] Call  to schedule follow up  Augustine Radar, MD [address] [place] [address] [phone]  Call  as needed           This chart was dictated using voice recognition software.  Despite best efforts to proofread,  errors can occur which can change the documentation meaning.    Nira Conn, MD 12/10/21 [phone]

## 2021-12-10 NOTE — ED Notes (Signed)
Patient transported to Ultrasound 

## 2021-12-11 LAB — GC/CHLAMYDIA PROBE AMP (~~LOC~~) NOT AT ARMC
Chlamydia: NEGATIVE
Comment: NEGATIVE
Comment: NORMAL
Neisseria Gonorrhea: NEGATIVE

## 2021-12-22 ENCOUNTER — Encounter (HOSPITAL_COMMUNITY): Payer: Self-pay | Admitting: Obstetrics and Gynecology

## 2021-12-22 ENCOUNTER — Inpatient Hospital Stay (HOSPITAL_COMMUNITY): Payer: BC Managed Care – PPO

## 2021-12-22 ENCOUNTER — Other Ambulatory Visit: Payer: Self-pay

## 2021-12-22 ENCOUNTER — Inpatient Hospital Stay (HOSPITAL_COMMUNITY)
Admission: AD | Admit: 2021-12-22 | Discharge: 2021-12-23 | Disposition: A | Discharge status: Home / Self Care | Payer: BC Managed Care – PPO | Attending: Obstetrics and Gynecology | Admitting: Obstetrics and Gynecology

## 2021-12-22 DIAGNOSIS — Z3A08 8 weeks gestation of pregnancy: Secondary | ICD-10-CM | POA: Insufficient documentation

## 2021-12-22 DIAGNOSIS — O3680X0 Pregnancy with inconclusive fetal viability, not applicable or unspecified: Secondary | ICD-10-CM

## 2021-12-22 DIAGNOSIS — O209 Hemorrhage in early pregnancy, unspecified: Secondary | ICD-10-CM | POA: Diagnosis not present

## 2021-12-22 DIAGNOSIS — Z3A00 Weeks of gestation of pregnancy not specified: Secondary | ICD-10-CM | POA: Diagnosis not present

## 2021-12-22 DIAGNOSIS — O09521 Supervision of elderly multigravida, first trimester: Secondary | ICD-10-CM | POA: Diagnosis not present

## 2021-12-22 DIAGNOSIS — Z90710 Acquired absence of both cervix and uterus: Secondary | ICD-10-CM | POA: Diagnosis not present

## 2021-12-22 DIAGNOSIS — O039 Complete or unspecified spontaneous abortion without complication: Secondary | ICD-10-CM | POA: Insufficient documentation

## 2021-12-22 DIAGNOSIS — Z3201 Encounter for pregnancy test, result positive: Secondary | ICD-10-CM | POA: Diagnosis not present

## 2021-12-22 LAB — URINALYSIS, ROUTINE W REFLEX MICROSCOPIC
Bilirubin Urine: NEGATIVE
Glucose, UA: NEGATIVE mg/dL
Ketones, ur: NEGATIVE mg/dL
Nitrite: NEGATIVE
Protein, ur: NEGATIVE mg/dL
RBC / HPF: >50 RBC/hpf — ABNORMAL HIGH (ref 0–5)
Specific Gravity, Urine: 1.011 (ref 1.005–1.030)
pH: 5 (ref 5.0–8.0)

## 2021-12-22 LAB — CBC
HCT: 32.1 % — ABNORMAL LOW (ref 36.0–46.0)
Hemoglobin: 11.1 g/dL — ABNORMAL LOW (ref 12.0–15.0)
MCH: 28.3 pg (ref 26.0–34.0)
MCHC: 34.6 g/dL (ref 30.0–36.0)
MCV: 81.9 fL (ref 80.0–100.0)
Platelets: 209 10*3/uL (ref 150–400)
RBC: 3.92 MIL/uL (ref 3.87–5.11)
RDW: 14 % (ref 11.5–15.5)
WBC: 7 10*3/uL (ref 4.0–10.5)
nRBC: 0 % (ref 0.0–0.2)

## 2021-12-22 MED ORDER — ACETAMINOPHEN 500 MG PO TABS
1000.0000 mg | ORAL_TABLET | Freq: Once | ORAL | Status: AC
  Administered 2021-12-22: 1000 mg via ORAL
  Filled 2021-12-22: qty 2

## 2021-12-22 NOTE — MAU Note (Signed)
Pt says did HPT - 11-17-2021- positive  Went to [facility]- positive - labs and U/S  Tomorrow- is follow up with OB VB started 1-2 weeks ago  Became heavier this am . Pad on in Triage - pad saturated  red blood  Feels cramping- was also 1-2 weeks ago .

## 2021-12-22 NOTE — MAU Provider Note (Signed)
History     CSN: [id number]  Arrival date and time: 12/22/21 2053   Event Date/Time   First Provider Initiated Contact with Patient 12/22/21 2201      Chief Complaint  Patient presents with   Vaginal Bleeding   Abdominal Pain   Abigail Stuart, a  41 y.o. G3P1011 at [redacted]w[redacted]d presents to MAU with complaints of abdominal Cramping and vaginal bleeding for the last "few weeks." Patient states bleeding was light "a few weeks ago" but has become more Bright red with clots today. Patient also endorses More pain, currently rating 7/10. Attempted aleve today without relief at 2030. Endorses Passing "dime-sized" clots.  Denies passing tissue. Heaviest today, endorses saturating pads starting this evening. Denies lightheadedness and dizziness but endorses headaches. Headaches on-going since finding out is pregnant. Patient states current headache is 3/10. Denies History of migraines and headaches prior to pregnancy. Patient has some life stressors that she believes can be contributing to headaches. She Denies chest pain, nausea and vomiting. Denies vaginal and urinary symptoms. Denies HTN.   Vaginal Bleeding The patient's pertinent negatives include no pelvic pain or vaginal discharge. Associated symptoms include abdominal pain and headaches. Pertinent negatives include no back pain, chills, constipation, diarrhea, dysuria, fever, nausea or vomiting.  Abdominal Pain Associated symptoms include headaches. Pertinent negatives include no constipation, diarrhea, dysuria, fever, nausea or vomiting.    OB History     Gravida  3   Para  1   Term  1   Preterm      AB  1   Living  1      SAB      IAB  1   Ectopic      Multiple      Live Births  1           History reviewed. No pertinent past medical history.  Past Surgical History:  Procedure Laterality Date   BUNIONECTOMY     CESAREAN SECTION     CHOLECYSTECTOMY N/A 11/06/2019   Procedure: LAPAROSCOPIC CHOLECYSTECTOMY WITH  INTRAOPERATIVE CHOLANGIOGRAM;  Surgeon: Diamantina Monks, MD;  Location: [facility] ORS;  Service: General;  Laterality: N/A;    History reviewed. No pertinent family history.  Social History   Tobacco Use   Smoking status: Never    Passive exposure: Never   Smokeless tobacco: Never  Vaping Use   Vaping Use: Never used  Substance Use Topics   Alcohol use: Yes    Comment: socially   Drug use: No    Allergies: No Known Allergies  Medications Prior to Admission  Medication Sig Dispense Refill Last Dose   albuterol (VENTOLIN HFA) 108 (90 Base) MCG/ACT inhaler Inhale 2 puffs into the lungs every 6 (six) hours as needed for wheezing or shortness of breath. (Patient not taking: Reported on 09/23/2021)      cetirizine (ZYRTEC) 10 MG tablet Take 10 mg by mouth daily as needed for allergies.       diphenhydrAMINE (BENADRYL) 25 MG tablet Take 25 mg by mouth at bedtime as needed for allergies. (Patient not taking: Reported on 09/23/2021)      ferrous sulfate 325 (65 FE) MG tablet Take 325 mg by mouth as needed.      gabapentin (NEURONTIN) 300 MG capsule Take 300 mg by mouth at bedtime as needed (nerve pain).      ibuprofen (ADVIL) 800 MG tablet Take 800 mg by mouth daily as needed for headache or cramping.      montelukast (SINGULAIR)  10 MG tablet Take 10 mg by mouth at bedtime as needed (allergies).   6    Multiple Vitamin (MULTIVITAMIN WITH MINERALS) TABS tablet Take 1-2 tablets by mouth See admin instructions. Take 2 tablets by mouth every morning and 1 tablet at night      Multiple Vitamins-Minerals (HAIR/SKIN/NAILS/BIOTIN PO) Take 1-2 tablets by mouth See admin instructions. Take 2 tablets by mouth every morning and 1 tablet at night       Review of Systems  Constitutional:  Negative for chills, fatigue and fever.  Eyes:  Negative for pain and visual disturbance.  Respiratory:  Negative for apnea, shortness of breath and wheezing.   Cardiovascular:  Negative for chest pain and palpitations.   Gastrointestinal:  Positive for abdominal pain. Negative for constipation, diarrhea, nausea and vomiting.  Genitourinary:  Positive for vaginal bleeding. Negative for difficulty urinating, dysuria, pelvic pain, vaginal discharge and vaginal pain.  Musculoskeletal:  Negative for back pain.  Neurological:  Positive for headaches. Negative for dizziness, seizures, weakness and light-headedness.  Psychiatric/Behavioral:  Negative for suicidal ideas.    Physical Exam   Blood pressure 124/62, pulse (!) 57, temperature 98.4 F (36.9 C), temperature source Oral, resp. rate 18, height 5\' 3"  (1.6 m), weight 100.7 kg, last menstrual period 10/24/2021.  Physical Exam Vitals and nursing note reviewed. Exam conducted with a chaperone present.  Constitutional:      General: She is not in acute distress.    Appearance: Normal appearance.  HENT:     Head: Normocephalic.  Cardiovascular:     Rate and Rhythm: Normal rate and regular rhythm.  Pulmonary:     Effort: Pulmonary effort is normal.  Abdominal:     Palpations: Abdomen is soft.     Tenderness: There is abdominal tenderness in the right lower quadrant. There is no right CVA tenderness, left CVA tenderness or guarding.  Genitourinary:    Vagina: Bleeding present.  Musculoskeletal:     Cervical back: Normal range of motion.  Skin:    General: Skin is warm and dry.     Capillary Refill: Capillary refill takes less than 2 seconds.  Neurological:     Mental Status: She is alert and oriented to person, place, and time.  Psychiatric:        Mood and Affect: Mood normal.     MAU Course  Procedures Orders Placed This Encounter  Procedures   Wet prep, genital   US OB LESS THAN 14 WEEKS WITH OB TRANSVAGINAL   CBC   hCG, quantitative, pregnancy   Urinalysis, Routine w reflex microscopic   Diet NPO time specified   ABO/Rh   Meds ordered this encounter  Medications   acetaminophen (TYLENOL) tablet 1,000 mg   Results for orders placed  or performed during the hospital encounter of 12/22/21 (from the past 24 hour(s))  CBC     Status: Abnormal   Collection Time: 12/22/21 11:19 PM  Result Value Ref Range   WBC 7.0 4.0 - 10.5 K/uL   RBC 3.92 3.87 - 5.11 MIL/uL   Hemoglobin 11.1 (L) 12.0 - 15.0 g/dL   HCT 32.1 (L) 36.0 - 46.0 %   MCV 81.9 80.0 - 100.0 fL   MCH 28.3 26.0 - 34.0 pg   MCHC 34.6 30.0 - 36.0 g/dL   RDW 14.0 11.5 - 15.5 %   Platelets 209 150 - 400 K/uL   nRBC 0.0 0.0 - 0.2 %  ABO/Rh     Status: None   Collection  Time: 12/22/21 11:19 PM  Result Value Ref Range   ABO/RH(D)      O POS Performed at [facility], [address], [place], [place] [address]   hCG, quantitative, pregnancy     Status: Abnormal   Collection Time: 12/22/21 11:19 PM  Result Value Ref Range   hCG, Beta Chain, Quant, S 10,474 (H) <5 mIU/mL  Urinalysis, Routine w reflex microscopic     Status: Abnormal   Collection Time: 12/22/21 11:20 PM  Result Value Ref Range   Color, Urine YELLOW YELLOW   APPearance HAZY (A) CLEAR   Specific Gravity, Urine 1.011 1.005 - 1.030   pH 5.0 5.0 - 8.0   Glucose, UA NEGATIVE NEGATIVE mg/dL   Hgb urine dipstick LARGE (A) NEGATIVE   Bilirubin Urine NEGATIVE NEGATIVE   Ketones, ur NEGATIVE NEGATIVE mg/dL   Protein, ur NEGATIVE NEGATIVE mg/dL   Nitrite NEGATIVE NEGATIVE   Leukocytes,Ua TRACE (A) NEGATIVE   RBC / HPF >50 (H) 0 - 5 RBC/hpf   WBC, UA 0-5 0 - 5 WBC/hpf   Bacteria, UA RARE (A) NONE SEEN   Squamous Epithelial / LPF 0-5 0 - 5  Wet prep, genital     Status: Abnormal   Collection Time: 12/22/21 11:40 PM   Specimen: PATH Cytology Cervicovaginal Ancillary Only  Result Value Ref Range   Yeast Wet Prep HPF POC NONE SEEN NONE SEEN   Trich, Wet Prep NONE SEEN NONE SEEN   Clue Cells Wet Prep HPF POC PRESENT (A) NONE SEEN   WBC, Wet Prep HPF POC <10 <10   Sperm NONE SEEN     US OB Transvaginal  Result Date: 12/23/2021 CLINICAL DATA:  Positive pregnancy test with vaginal bleeding for  few days, subsequent encounter EXAM: TRANSVAGINAL OB ULTRASOUND TECHNIQUE: Transvaginal ultrasound was performed for complete evaluation of the gestation as well as the maternal uterus, adnexal regions, and pelvic cul-de-sac. COMPARISON:  12/10/2021 FINDINGS: Intrauterine gestational sac: Absent Yolk sac:  Absent Embryo:  Absent Maternal uterus/adnexae: Right ovary is within normal limits. Left ovary is not well visualized. Fluid and echogenic material is identified within the cervix and superior aspect of the vaginal vault consistent with a miscarriage in progress. IMPRESSION: Previously seen gestational sac is no longer identified. There is echogenic material within the cervix and superior vaginal wall consistent with a miscarriage in progress. Electronically Signed   By: Alcide Clever M.D.   On: 12/23/2021 00:06     MDM - Patient passed a large "clot of something" following the [place] and noted that bleeding and cramping has ceased.  - Patient hemodynamically stable  - Wet Prep present for clue cells otherwise normal  - Quant increased from 13 days ago, but not at this expectant appropriate rate for a normal pregnancy.   - US revealed a miscarriage in process, and in conjunction with "passing something" likely complete miscarriage at this point.  Assessment and Plan   1. Miscarriage   2. [redacted] weeks gestation of pregnancy    - Discussed that this is likely a miscarriage in process.  - Patient appropriately tearful.  - Patient has follow up with [facility] tomorrow and declines further follow up. Patient offered cytotec and declined at this time.  - Worsening signs and return precautions reviewed with patient.  - Patient discharged home in stable condition and may return to MAU as needed.   Idolina Mantell Danella Deis) Suzie Portela, MSN, CNM  12/23/21 12:56 AM

## 2021-12-23 ENCOUNTER — Other Ambulatory Visit: Payer: BC Managed Care – PPO

## 2021-12-23 DIAGNOSIS — O039 Complete or unspecified spontaneous abortion without complication: Secondary | ICD-10-CM | POA: Diagnosis not present

## 2021-12-23 DIAGNOSIS — Z3A08 8 weeks gestation of pregnancy: Secondary | ICD-10-CM | POA: Diagnosis not present

## 2021-12-23 LAB — HCG, QUANTITATIVE, PREGNANCY: hCG, Beta Chain, Quant, S: 10474 m[IU]/mL — ABNORMAL HIGH (ref <5)

## 2021-12-23 LAB — GC/CHLAMYDIA PROBE AMP (~~LOC~~) NOT AT ARMC
Chlamydia: NEGATIVE
Comment: NEGATIVE
Comment: NORMAL
Neisseria Gonorrhea: NEGATIVE

## 2021-12-23 LAB — WET PREP, GENITAL
Sperm: NONE SEEN
Trich, Wet Prep: NONE SEEN
WBC, Wet Prep HPF POC: <10 (ref <10)
Yeast Wet Prep HPF POC: NONE SEEN

## 2021-12-23 LAB — ABO/RH: ABO/RH(D): O POS

## 2021-12-29 ENCOUNTER — Encounter: Payer: Self-pay | Admitting: Student

## 2021-12-29 ENCOUNTER — Ambulatory Visit (INDEPENDENT_AMBULATORY_CARE_PROVIDER_SITE_OTHER): Payer: BC Managed Care – PPO | Admitting: Student

## 2021-12-29 ENCOUNTER — Other Ambulatory Visit (HOSPITAL_COMMUNITY)
Admission: RE | Admit: 2021-12-29 | Discharge: 2021-12-29 | Disposition: A | Discharge status: Home / Self Care | Payer: BC Managed Care – PPO | Source: Ambulatory Visit | Attending: Student | Admitting: Student

## 2021-12-29 ENCOUNTER — Other Ambulatory Visit: Payer: Self-pay

## 2021-12-29 VITALS — Ht 63.0 in | Wt 221.6 lb

## 2021-12-29 DIAGNOSIS — Z1231 Encounter for screening mammogram for malignant neoplasm of breast: Secondary | ICD-10-CM | POA: Diagnosis not present

## 2021-12-29 DIAGNOSIS — O039 Complete or unspecified spontaneous abortion without complication: Secondary | ICD-10-CM

## 2021-12-29 DIAGNOSIS — Z01419 Encounter for gynecological examination (general) (routine) without abnormal findings: Secondary | ICD-10-CM | POA: Diagnosis not present

## 2021-12-29 DIAGNOSIS — Z124 Encounter for screening for malignant neoplasm of cervix: Secondary | ICD-10-CM | POA: Diagnosis not present

## 2021-12-29 NOTE — Progress Notes (Signed)
ANNUAL EXAM Patient name: Abigail Stuart MRN [id number]  Date of birth: [birth year] Chief Complaint:   Annual Miscarriage History of Present Illness:   Abigail Stuart is a 41 y.o. G59P1011 African-American female being seen today for a routine annual exam.  Current complaints: Diagnosed with miscarriage last week. Reports increased cramping on Friday & passing some blood clots over the weekend. Since then bleeding has been scant & abdominal pain has resolved. No fever.  In process of finding new PCP since hers moved away.   Patient's last menstrual period was 10/24/2021 (exact date).   The pregnancy intention screening data noted above was reviewed. Potential methods of contraception were discussed. The patient elected to proceed with No data recorded.   Last pap 5 years ago. Results were:  Normal per patient .  Last mammogram: diagnostic mammogram in her 3s that was normal.  Family h/o breast cancer: yes Maternal grandmother      12/29/2021   10:20 AM  Depression screen PHQ 2/9  Decreased Interest 2  Down, Depressed, Hopeless 2  PHQ - 2 Score 4  Altered sleeping 1  Change in appetite 2  Feeling bad or failure about yourself  2  Trouble concentrating 2  Moving slowly or fidgety/restless 0  Suicidal thoughts 1  PHQ-9 Score 12        12/29/2021   10:21 AM  GAD 7 : Generalized Anxiety Score  Nervous, Anxious, on Edge 2  Control/stop worrying 3  Worry too much - different things 3  Trouble relaxing 3  Restless 2  Easily annoyed or irritable 3     Review of Systems:   Pertinent items are noted in HPI Denies any headaches, blurred vision, fatigue, shortness of breath, chest pain, abdominal pain, abnormal vaginal discharge/itching/odor/irritation, problems with periods, bowel movements, urination, or intercourse unless otherwise stated above. Pertinent History Reviewed:  Reviewed past medical,surgical, social and family history.  Reviewed problem list,  medications and allergies. Physical Assessment:   Vitals:   12/29/21 1019  Weight: 221 lb 9.6 oz (100.5 kg)  Height: 5\' 3"  (1.6 m)  Body mass index is 39.25 kg/m.        Physical Examination:   General appearance - well appearing, and in no distress  Mental status - alert, oriented to person, place, and time  Psych:  She has a normal mood and affect  Skin - warm and dry, normal color, no suspicious lesions noted  Chest - effort normal, all lung fields clear to auscultation bilaterally  Heart - normal rate and regular rhythm  Neck:  midline trachea, no thyromegaly or nodules  Breasts - breasts appear normal, no suspicious masses, no skin or nipple changes or  axillary nodes  Abdomen - soft, nontender, nondistended, no masses or organomegaly  Pelvic - VULVA: right bartholin gland enlarged ~golfball sized - no pain, tenderness, erythema, or drainage (per patient has been present for years) VAGINA: normal appearing vagina with normal color and discharge, no lesions  CERVIX: normal appearing cervix without discharge or lesions, no CMT  Thin prep pap is done with HR HPV cotesting  UTERUS: uterus is felt to be normal size, shape, consistency and nontender   ADNEXA: No adnexal masses or tenderness noted.    Chaperone present for exam  No results found for this or any previous visit (from the past 24 hour(s)).  Assessment & Plan:  1) Well-Woman Exam -Will contact with results -Patient to schedule f/u with PCP -Mammogram scheduled for 11/16  2) Miscarriage -Completed miscarriage per u/s in MAU last week. No bleeding today & patient currently asymptomatic. Not planning future pregnancy but undecided about contraception at this time. Considering nexplanon but would like to check with her insurance company first. Given list of contraception options & patient will follow up accordingly.   Labs/procedures today: Cytology pap  Mammogram:  scheduled Colonoscopy: @ 41yo, or sooner if  problems  Orders Placed This Encounter  Procedures   MM 3D SCREEN BREAST BILATERAL    Meds: No orders of the defined types were placed in this encounter.   Follow-up: No follow-ups on file.  Judeth Horn, NP 12/29/2021 12:48 PM

## 2021-12-31 LAB — CYTOLOGY - PAP
Comment: NEGATIVE
Diagnosis: NEGATIVE
High risk HPV: NEGATIVE

## 2022-01-15 ENCOUNTER — Ambulatory Visit
Admission: RE | Admit: 2022-01-15 | Discharge: 2022-01-15 | Disposition: A | Discharge status: Home / Self Care | Payer: BC Managed Care – PPO | Source: Ambulatory Visit | Attending: Student | Admitting: Student

## 2022-01-15 DIAGNOSIS — Z1231 Encounter for screening mammogram for malignant neoplasm of breast: Secondary | ICD-10-CM

## 2022-03-02 ENCOUNTER — Ambulatory Visit (HOSPITAL_COMMUNITY)
Admission: EM | Admit: 2022-03-02 | Discharge: 2022-03-02 | Disposition: A | Discharge status: Home / Self Care | Payer: BC Managed Care – PPO | Attending: Physician Assistant | Admitting: Physician Assistant

## 2022-03-02 ENCOUNTER — Encounter (HOSPITAL_COMMUNITY): Payer: Self-pay

## 2022-03-02 DIAGNOSIS — K810 Acute cholecystitis: Secondary | ICD-10-CM | POA: Insufficient documentation

## 2022-03-02 DIAGNOSIS — Z7951 Long term (current) use of inhaled steroids: Secondary | ICD-10-CM | POA: Insufficient documentation

## 2022-03-02 DIAGNOSIS — J111 Influenza due to unidentified influenza virus with other respiratory manifestations: Secondary | ICD-10-CM

## 2022-03-02 DIAGNOSIS — J069 Acute upper respiratory infection, unspecified: Secondary | ICD-10-CM

## 2022-03-02 DIAGNOSIS — Z1152 Encounter for screening for COVID-19: Secondary | ICD-10-CM | POA: Diagnosis not present

## 2022-03-02 DIAGNOSIS — Z79899 Other long term (current) drug therapy: Secondary | ICD-10-CM | POA: Diagnosis not present

## 2022-03-02 LAB — POC INFLUENZA A AND B ANTIGEN (URGENT CARE ONLY)
INFLUENZA A ANTIGEN, POC: NEGATIVE
INFLUENZA B ANTIGEN, POC: POSITIVE — AB

## 2022-03-02 MED ORDER — ALBUTEROL SULFATE HFA 108 (90 BASE) MCG/ACT IN AERS: 2.0000 | INHALATION_SPRAY | Freq: Four times a day (QID) | RESPIRATORY_TRACT | 0 refills | Status: AC | PRN

## 2022-03-02 MED ORDER — PROMETHAZINE-DM 6.25-15 MG/5ML PO SYRP: 5.0000 mL | ORAL_SOLUTION | Freq: Four times a day (QID) | ORAL | 0 refills | Status: AC | PRN

## 2022-03-02 NOTE — ED Triage Notes (Signed)
Pt reports she was in contact with her daughter who has the flu. She  has a cough, runny nose, headache, body aches, and sore throat  X 3 days. Took theraflu which gave slight relief.

## 2022-03-02 NOTE — Discharge Instructions (Addendum)
You are positive for Flu Recommend Mucinex and Flonase. Use cough syrup as needed. Can use inhaler as needed for shortness of breath or wheezing.  Drink plenty of fluids, rest.

## 2022-03-02 NOTE — ED Provider Notes (Addendum)
[facility]    CSN: [id number] Arrival date & time: 03/02/22  1911      History   Chief Complaint No chief complaint on file.   HPI Abigail Stuart is a 42 y.o. female.   Patient complains of cough, congestion, headaches, body aches, sore throat that started about 3 days ago.  She reports her daughter is positive for the flu.  She has been taking TheraFlu with minimal relief.  She denies wheezing, shortness of breath, nausea, vomiting.     Past Medical History:  Diagnosis Date   No pertinent past medical history     Patient Active Problem List   Diagnosis Date Noted   Uveitis 08/15/2021   Acute cholecystitis 11/05/2019    Past Surgical History:  Procedure Laterality Date   BUNIONECTOMY     CESAREAN SECTION     CHOLECYSTECTOMY N/A 11/06/2019   Procedure: LAPAROSCOPIC CHOLECYSTECTOMY WITH INTRAOPERATIVE CHOLANGIOGRAM;  Surgeon: Jesusita Oka, MD;  Location: [facility];  Service: General;  Laterality: N/A;    OB History     Gravida  3   Para  1   Term  1   Preterm      AB  2   Living  1      SAB  1   IAB  1   Ectopic      Multiple      Live Births  1            Home Medications    Prior to Admission medications   Medication Sig Start Date End Date Taking? Authorizing Provider  promethazine-dextromethorphan (PROMETHAZINE-DM) 6.25-15 MG/5ML syrup Take 5 mLs by mouth 4 (four) times daily as needed for cough. 03/02/22  Yes Ward, Lenise Arena, PA-C  albuterol (VENTOLIN HFA) 108 (90 Base) MCG/ACT inhaler Inhale 2 puffs into the lungs every 6 (six) hours as needed for wheezing or shortness of breath. 03/02/22   Ward, Lenise Arena, PA-C  cetirizine (ZYRTEC) 10 MG tablet Take 10 mg by mouth daily as needed for allergies.  Patient not taking: Reported on 12/29/2021    [provider]  diphenhydrAMINE (BENADRYL) 25 MG tablet Take 25 mg by mouth at bedtime as needed for allergies. Patient not taking: Reported on 09/23/2021    [provider]  ferrous sulfate 325 (65 FE) MG tablet Take 325 mg by mouth as needed. Patient not taking: Reported on 12/29/2021    [provider]  gabapentin (NEURONTIN) 300 MG capsule Take 300 mg by mouth at bedtime as needed (nerve pain).    [provider]  ibuprofen (ADVIL) 800 MG tablet Take 800 mg by mouth daily as needed for headache or cramping.    [provider]  montelukast (SINGULAIR) 10 MG tablet Take 10 mg by mouth at bedtime as needed (allergies).  05/29/15   [provider]  Multiple Vitamin (MULTIVITAMIN WITH MINERALS) TABS tablet Take 1-2 tablets by mouth See admin instructions. Take 2 tablets by mouth every morning and 1 tablet at night    [provider]  Multiple Vitamins-Minerals (HAIR/SKIN/NAILS/BIOTIN PO) Take 1-2 tablets by mouth See admin instructions. Take 2 tablets by mouth every morning and 1 tablet at night    [provider]    Family History Family History  Problem Relation Age of Onset   Breast cancer Maternal Grandmother     Social History Social History   Tobacco Use   Smoking status: Never    Passive exposure: Never  Smokeless tobacco: Never  Vaping Use   Vaping Use: Never used  Substance Use Topics   Alcohol use: Yes    Comment: socially   Drug use: No     Allergies   Patient has no known allergies.   Review of Systems Review of Systems  Constitutional:  Negative for chills and fever.  HENT:  Positive for congestion and sore throat. Negative for ear pain.   Eyes:  Negative for pain and visual disturbance.  Respiratory:  Positive for cough. Negative for shortness of breath.   Cardiovascular:  Negative for chest pain and palpitations.  Gastrointestinal:  Negative for abdominal pain and vomiting.  Genitourinary:  Negative for dysuria and hematuria.  Musculoskeletal:  Positive for myalgias. Negative for arthralgias and back pain.  Skin:  Negative for color change and rash.   Neurological:  Positive for headaches. Negative for seizures and syncope.  All other systems reviewed and are negative.    Physical Exam Triage Vital Signs ED Triage Vitals  Enc Vitals Group     BP 03/02/22 2009 135/88     Pulse Rate 03/02/22 2009 71     Resp 03/02/22 2009 18     Temp 03/02/22 2009 99.1 F (37.3 C)     Temp Source 03/02/22 2009 Oral     SpO2 03/02/22 2009 95 %     Weight --      Height --      Head Circumference --      Peak Flow --      Pain Score 03/02/22 2011 8     Pain Loc --      Pain Edu? --      Excl. in [place]? --    No data found.  Updated Vital Signs BP 135/88   Pulse 71   Temp 99.1 F (37.3 C) (Oral)   Resp 18   LMP 02/25/2022   SpO2 95%   Breastfeeding No   Visual Acuity Right Eye Distance:   Left Eye Distance:   Bilateral Distance:    Right Eye Near:   Left Eye Near:    Bilateral Near:     Physical Exam Vitals and nursing note reviewed.  Constitutional:      General: She is not in acute distress.    Appearance: She is well-developed.  HENT:     Head: Normocephalic and atraumatic.  Eyes:     Conjunctiva/sclera: Conjunctivae normal.  Cardiovascular:     Rate and Rhythm: Normal rate and regular rhythm.     Heart sounds: No murmur heard. Pulmonary:     Effort: Pulmonary effort is normal. No respiratory distress.     Breath sounds: Normal breath sounds.  Abdominal:     Palpations: Abdomen is soft.     Tenderness: There is no abdominal tenderness.  Musculoskeletal:        General: No swelling.     Cervical back: Neck supple.  Skin:    General: Skin is warm and dry.     Capillary Refill: Capillary refill takes less than 2 seconds.  Neurological:     Mental Status: She is alert.  Psychiatric:        Mood and Affect: Mood normal.      UC Treatments / Results  Labs (all labs ordered are listed, but only abnormal results are displayed) Labs Reviewed  SARS CORONAVIRUS 2 (TAT 6-24 HRS)  POC INFLUENZA A AND B ANTIGEN  (URGENT CARE ONLY)    EKG   Radiology No  results found.  Procedures Procedures (including critical care time)  Medications Ordered in UC Medications - No data to display  Initial Impression / Assessment and Plan / UC Course  I have reviewed the triage vital signs and the nursing notes.  Pertinent labs & imaging results that were available during my care of the patient were reviewed by me and considered in my medical decision making (see chart for details).     Positive for Flu  Patient with positive contact with flu.  She is out of the window for Tamiflu at this time.  COVID test pending.  Patient is overall well-appearing, in no acute distress, vitals within normal limits, lungs clear to auscultation.  Patient is stable for discharge with supportive care.  Return precautions discussed Final Clinical Impressions(s) / UC Diagnoses   Final diagnoses:  Influenza with respiratory manifestation     Discharge Instructions      You are positive for Flu Recommend Mucinex and Flonase. Use cough syrup as needed. Can use inhaler as needed for shortness of breath or wheezing.  Drink plenty of fluids, rest.      ED Prescriptions     Medication Sig Dispense Auth. Provider   promethazine-dextromethorphan (PROMETHAZINE-DM) 6.25-15 MG/5ML syrup Take 5 mLs by mouth 4 (four) times daily as needed for cough. 118 mL Ward, Shanda Bumps Z, PA-C   albuterol (VENTOLIN HFA) 108 (90 Base) MCG/ACT inhaler Inhale 2 puffs into the lungs every 6 (six) hours as needed for wheezing or shortness of breath. 1 each Ward, Tylene Fantasia, PA-C      PDMP not reviewed this encounter.   Ward, Tylene Fantasia, PA-C 03/02/22 2030    Ward, Tylene Fantasia, PA-C 03/02/22 2035

## 2022-03-03 LAB — SARS CORONAVIRUS 2 (TAT 6-24 HRS): SARS Coronavirus 2: NEGATIVE

## 2022-09-09 NOTE — Progress Notes (Deleted)
Office Visit Note  Patient: Abigail Stuart             Date of Birth: April 16, 1980           MRN: [id number]             PCP: Augustine Radar, FNP Referring: Augustine Radar, FNP Visit Date: 09/23/2022 Occupation: @GUAROCC @  Subjective:  No chief complaint on file.   History of Present Illness: Abigail Stuart is a 42 y.o. female ***     Activities of Daily Living:  Patient reports morning stiffness for *** {minute/hour:19697}.   Patient {ACTIONS;DENIES/REPORTS:21021675::"Denies"} nocturnal pain.  Difficulty dressing/grooming: {ACTIONS;DENIES/REPORTS:21021675::"Denies"} Difficulty climbing stairs: {ACTIONS;DENIES/REPORTS:21021675::"Denies"} Difficulty getting out of chair: {ACTIONS;DENIES/REPORTS:21021675::"Denies"} Difficulty using hands for taps, buttons, cutlery, and/or writing: {ACTIONS;DENIES/REPORTS:21021675::"Denies"}  No Rheumatology ROS completed.   PMFS History:  Patient Active Problem List   Diagnosis Date Noted   Uveitis 08/15/2021   Acute cholecystitis 11/05/2019    Past Medical History:  Diagnosis Date   No pertinent past medical history     Family History  Problem Relation Age of Onset   Breast cancer Maternal Grandmother    Past Surgical History:  Procedure Laterality Date   BUNIONECTOMY     CESAREAN SECTION     CHOLECYSTECTOMY N/A 11/06/2019   Procedure: LAPAROSCOPIC CHOLECYSTECTOMY WITH INTRAOPERATIVE CHOLANGIOGRAM;  Surgeon: Diamantina Monks, MD;  Location: [facility];  Service: General;  Laterality: N/A;   Social History   Social History Narrative   Not on file   Immunization History  Administered Date(s) Administered   PFIZER(Purple Top)SARS-COV-2 Vaccination 06/04/2019, 06/24/2019     Objective: Vital Signs: There were no vitals taken for this visit.   Physical Exam   Musculoskeletal Exam: ***  CDAI Exam: CDAI Score: -- Patient Global: --; Provider Global: -- Swollen: --; Tender: -- Joint Exam 09/23/2022   No joint  exam has been documented for this visit   There is currently no information documented on the homunculus. Go to the Rheumatology activity and complete the homunculus joint exam.  Investigation: No additional findings.  Imaging: No results found.  Recent Labs: Lab Results  Component Value Date   WBC 7.0 12/22/2021   HGB 11.1 (L) 12/22/2021   PLT 209 12/22/2021   NA 139 09/05/2021   K 4.4 09/05/2021   CL 105 09/05/2021   CO2 18 (L) 09/05/2021   GLUCOSE 83 09/05/2021   BUN 7 09/05/2021   CREATININE 0.85 09/05/2021   BILITOT 0.5 09/05/2021   ALKPHOS 76 11/05/2019   AST 12 09/05/2021   ALT 10 09/05/2021   PROT 7.6 09/05/2021   PROT 7.9 09/05/2021   ALBUMIN 4.2 11/05/2019   CALCIUM 9.3 09/05/2021   GFRAA >60 11/06/2019   QFTBGOLDPLUS NEGATIVE 09/05/2021    Speciality Comments: No specialty comments available.  Procedures:  No procedures performed Allergies: Patient has no known allergies.   Assessment / Plan:     Visit Diagnoses: No diagnosis found.  Orders: No orders of the defined types were placed in this encounter.  No orders of the defined types were placed in this encounter.   Face-to-face time spent with patient was *** minutes. Greater than 50% of time was spent in counseling and coordination of care.  Follow-Up Instructions: No follow-ups on file.   Ellen Henri, CMA  Note - This record has been created using Animal nutritionist.  Chart creation errors have been sought, but may not always  have been located. Such creation errors do not reflect on  the standard of medical care.

## 2022-09-23 ENCOUNTER — Ambulatory Visit: Payer: BC Managed Care – PPO | Admitting: Rheumatology

## 2022-09-23 DIAGNOSIS — R5383 Other fatigue: Secondary | ICD-10-CM

## 2022-09-23 DIAGNOSIS — H209 Unspecified iridocyclitis: Secondary | ICD-10-CM

## 2022-09-23 DIAGNOSIS — M19071 Primary osteoarthritis, right ankle and foot: Secondary | ICD-10-CM

## 2022-09-23 DIAGNOSIS — M249 Joint derangement, unspecified: Secondary | ICD-10-CM

## 2022-09-23 DIAGNOSIS — G8929 Other chronic pain: Secondary | ICD-10-CM

## 2022-09-23 DIAGNOSIS — M25511 Pain in right shoulder: Secondary | ICD-10-CM

## 2022-09-23 DIAGNOSIS — M2141 Flat foot [pes planus] (acquired), right foot: Secondary | ICD-10-CM

## 2022-09-23 DIAGNOSIS — F32A Depression, unspecified: Secondary | ICD-10-CM

## 2022-09-23 DIAGNOSIS — M170 Bilateral primary osteoarthritis of knee: Secondary | ICD-10-CM

## 2022-09-23 DIAGNOSIS — E559 Vitamin D deficiency, unspecified: Secondary | ICD-10-CM

## 2022-09-23 DIAGNOSIS — M19041 Primary osteoarthritis, right hand: Secondary | ICD-10-CM

## 2022-09-23 DIAGNOSIS — M2550 Pain in unspecified joint: Secondary | ICD-10-CM

## 2022-10-28 NOTE — Progress Notes (Deleted)
Office Visit Note  Patient: Abigail Stuart             Date of Birth: May 28, 1980           MRN: [id number]             PCP: Augustine Radar, FNP Referring: Augustine Radar, FNP Visit Date: 11/11/2022 Occupation: @GUAROCC @  Subjective:    History of Present Illness: Abigail Stuart is a 42 y.o. female with history of uveitis and osteoarthritis.     Activities of Daily Living:  Patient reports morning stiffness for *** {minute/hour:19697}.   Patient {ACTIONS;DENIES/REPORTS:21021675::"Denies"} nocturnal pain.  Difficulty dressing/grooming: {ACTIONS;DENIES/REPORTS:21021675::"Denies"} Difficulty climbing stairs: {ACTIONS;DENIES/REPORTS:21021675::"Denies"} Difficulty getting out of chair: {ACTIONS;DENIES/REPORTS:21021675::"Denies"} Difficulty using hands for taps, buttons, cutlery, and/or writing: {ACTIONS;DENIES/REPORTS:21021675::"Denies"}  No Rheumatology ROS completed.   PMFS History:  Patient Active Problem List   Diagnosis Date Noted  . Uveitis 08/15/2021  . Acute cholecystitis 11/05/2019    Past Medical History:  Diagnosis Date  . No pertinent past medical history     Family History  Problem Relation Age of Onset  . Breast cancer Maternal Grandmother    Past Surgical History:  Procedure Laterality Date  . BUNIONECTOMY    . CESAREAN SECTION    . CHOLECYSTECTOMY N/A 11/06/2019   Procedure: LAPAROSCOPIC CHOLECYSTECTOMY WITH INTRAOPERATIVE CHOLANGIOGRAM;  Surgeon: Diamantina Monks, MD;  Location: [facility];  Service: General;  Laterality: N/A;   Social History   Social History Narrative  . Not on file   Immunization History  Administered Date(s) Administered  . PFIZER(Purple Top)SARS-COV-2 Vaccination 06/04/2019, 06/24/2019     Objective: Vital Signs: There were no vitals taken for this visit.   Physical Exam Vitals and nursing note reviewed.  Constitutional:      Appearance: She is well-developed.  HENT:     Head: Normocephalic and atraumatic.   Eyes:     Conjunctiva/sclera: Conjunctivae normal.  Cardiovascular:     Rate and Rhythm: Normal rate and regular rhythm.     Heart sounds: Normal heart sounds.  Pulmonary:     Effort: Pulmonary effort is normal.     Breath sounds: Normal breath sounds.  Abdominal:     General: Bowel sounds are normal.     Palpations: Abdomen is soft.  Musculoskeletal:     Cervical back: Normal range of motion.  Lymphadenopathy:     Cervical: No cervical adenopathy.  Skin:    General: Skin is warm and dry.     Capillary Refill: Capillary refill takes less than 2 seconds.  Neurological:     Mental Status: She is alert and oriented to person, place, and time.  Psychiatric:        Behavior: Behavior normal.     Musculoskeletal Exam: ***  CDAI Exam: CDAI Score: -- Patient Global: --; Provider Global: -- Swollen: --; Tender: -- Joint Exam 11/11/2022   No joint exam has been documented for this visit   There is currently no information documented on the homunculus. Go to the Rheumatology activity and complete the homunculus joint exam.  Investigation: No additional findings.  Imaging: No results found.  Recent Labs: Lab Results  Component Value Date   WBC 7.0 12/22/2021   HGB 11.1 (L) 12/22/2021   PLT 209 12/22/2021   NA 139 09/05/2021   K 4.4 09/05/2021   CL 105 09/05/2021   CO2 18 (L) 09/05/2021   GLUCOSE 83 09/05/2021   BUN 7 09/05/2021   CREATININE 0.85 09/05/2021   BILITOT 0.5 09/05/2021  ALKPHOS 76 11/05/2019   AST 12 09/05/2021   ALT 10 09/05/2021   PROT 7.6 09/05/2021   PROT 7.9 09/05/2021   ALBUMIN 4.2 11/05/2019   CALCIUM 9.3 09/05/2021   GFRAA >60 11/06/2019   QFTBGOLDPLUS NEGATIVE 09/05/2021    Speciality Comments: No specialty comments available.  Procedures:  No procedures performed Allergies: Patient has no known allergies.   Assessment / Plan:     Visit Diagnoses: Uveitis  Chronic pain of both shoulders  Primary osteoarthritis of both  hands  Hypermobility of joint  Primary osteoarthritis of both knees  Primary osteoarthritis of both feet  Pes planus of both feet  Chronic SI joint pain  Other fatigue  Vitamin D deficiency  Anxiety and depression  History of bunionectomy of both great toes  Orders: No orders of the defined types were placed in this encounter.  No orders of the defined types were placed in this encounter.   Face-to-face time spent with patient was *** minutes. Greater than 50% of time was spent in counseling and coordination of care.  Follow-Up Instructions: No follow-ups on file.   Gearldine Bienenstock, PA-C  Note - This record has been created using Dragon software.  Chart creation errors have been sought, but may not always  have been located. Such creation errors do not reflect on  the standard of medical care.

## 2022-11-11 ENCOUNTER — Ambulatory Visit: Payer: BC Managed Care – PPO | Admitting: Physician Assistant

## 2022-11-11 DIAGNOSIS — E559 Vitamin D deficiency, unspecified: Secondary | ICD-10-CM

## 2022-11-11 DIAGNOSIS — M249 Joint derangement, unspecified: Secondary | ICD-10-CM

## 2022-11-11 DIAGNOSIS — M19072 Primary osteoarthritis, left ankle and foot: Secondary | ICD-10-CM

## 2022-11-11 DIAGNOSIS — M2141 Flat foot [pes planus] (acquired), right foot: Secondary | ICD-10-CM

## 2022-11-11 DIAGNOSIS — H209 Unspecified iridocyclitis: Secondary | ICD-10-CM

## 2022-11-11 DIAGNOSIS — R5383 Other fatigue: Secondary | ICD-10-CM

## 2022-11-11 DIAGNOSIS — F32A Depression, unspecified: Secondary | ICD-10-CM

## 2022-11-11 DIAGNOSIS — G8929 Other chronic pain: Secondary | ICD-10-CM

## 2022-11-11 DIAGNOSIS — M19041 Primary osteoarthritis, right hand: Secondary | ICD-10-CM

## 2022-11-11 DIAGNOSIS — Z9889 Other specified postprocedural states: Secondary | ICD-10-CM

## 2022-11-11 DIAGNOSIS — M170 Bilateral primary osteoarthritis of knee: Secondary | ICD-10-CM
